# Patient Record
Sex: Female | Born: 1960 | Race: White | Hispanic: Yes | Marital: Married | Smoking: Former smoker
Health system: Southern US, Community
[De-identification: ages and names within clinical notes are randomized; demographics above are authoritative.]

## PROBLEM LIST (undated history)

## (undated) DIAGNOSIS — N950 Postmenopausal bleeding: Secondary | ICD-10-CM

## (undated) DIAGNOSIS — Z973 Presence of spectacles and contact lenses: Secondary | ICD-10-CM

## (undated) DIAGNOSIS — F419 Anxiety disorder, unspecified: Secondary | ICD-10-CM

## (undated) DIAGNOSIS — I959 Hypotension, unspecified: Secondary | ICD-10-CM

## (undated) DIAGNOSIS — K9041 Non-celiac gluten sensitivity: Secondary | ICD-10-CM

## (undated) HISTORY — DX: Postmenopausal bleeding: N95.0

## (undated) HISTORY — DX: Anxiety disorder, unspecified: F41.9

## (undated) HISTORY — DX: Hypotension, unspecified: I95.9

## (undated) HISTORY — PX: APPENDECTOMY: SHX54

---

## 2011-05-19 DIAGNOSIS — J309 Allergic rhinitis, unspecified: Secondary | ICD-10-CM | POA: Insufficient documentation

## 2011-06-09 DIAGNOSIS — R29898 Other symptoms and signs involving the musculoskeletal system: Secondary | ICD-10-CM | POA: Insufficient documentation

## 2011-06-09 DIAGNOSIS — M539 Dorsopathy, unspecified: Secondary | ICD-10-CM | POA: Insufficient documentation

## 2011-06-09 DIAGNOSIS — M48061 Spinal stenosis, lumbar region without neurogenic claudication: Secondary | ICD-10-CM | POA: Insufficient documentation

## 2015-08-06 ENCOUNTER — Ambulatory Visit: Payer: Self-pay | Admitting: Urology

## 2015-08-08 ENCOUNTER — Ambulatory Visit (INDEPENDENT_AMBULATORY_CARE_PROVIDER_SITE_OTHER): Payer: PRIVATE HEALTH INSURANCE | Admitting: Urology

## 2015-08-08 ENCOUNTER — Encounter: Payer: Self-pay | Admitting: Urology

## 2015-08-08 VITALS — BP 94/61 | HR 69 | Ht 64.0 in | Wt 152.6 lb

## 2015-08-08 DIAGNOSIS — N952 Postmenopausal atrophic vaginitis: Secondary | ICD-10-CM

## 2015-08-08 DIAGNOSIS — R300 Dysuria: Secondary | ICD-10-CM

## 2015-08-08 LAB — URINALYSIS, COMPLETE
Bilirubin, UA: NEGATIVE
GLUCOSE, UA: NEGATIVE
KETONES UA: NEGATIVE
LEUKOCYTES UA: NEGATIVE
Nitrite, UA: NEGATIVE
Protein, UA: NEGATIVE
SPEC GRAV UA: 1.015 (ref 1.005–1.030)
Urobilinogen, Ur: 1 mg/dL (ref 0.2–1.0)
pH, UA: 8.5 — ABNORMAL HIGH (ref 5.0–7.5)

## 2015-08-08 LAB — MICROSCOPIC EXAMINATION
BACTERIA UA: NONE SEEN
WBC, UA: NONE SEEN /hpf (ref 0–?)

## 2015-08-08 MED ORDER — ESTROGENS, CONJUGATED 0.625 MG/GM VA CREA: 1.0000 | TOPICAL_CREAM | Freq: Every day | VAGINAL | Status: DC

## 2015-08-08 NOTE — Progress Notes (Signed)
08/08/2015 5:09 PM   Sheila Butler [birth year] [id number]  Referring provider: Janyth Contes, MD [address] [place], [place] [address]  Chief Complaint  Patient presents with  . New Patient (Initial Visit)    dysuria    HPI: 55 year old female referred for further evaluation of recurrent urinary tract infections.  She notes that she is been treated on 3 separate occasions over the past year for urinary tract infections. Prior to a year ago, she had no issues with this.  Review of UA/urine culture data reveal several suspicious UAs with presence of leukocyte esterase with urine cultures growing only 10-25,000 colonies of mixed urogenital flora.  It appears that she was treated both in February and April 2017 for these "infections".  With each incident, she describes clear onset of dysuria, change in her urine color and odor, and lower abdominal discomfort with urinary urgency/frequency. Each time, the symptoms improved with antibiotics. No associated fevers or chills.  No flank pain.   No associated gross hematuria.  She is sexually active with one partner. No risk for sexual transmitted infections. No pain with intercourse but she does have a history of significant vaginal dryness.  She does use a water-soluble lubricant with sexual activity. Incidentally, since developing these episodes of dysuria, she did change her lubricant to a scented brand and not her usual [place]. She since switched back.  She is perimenopausal and has irregular menses over the past year.  She does still have her uterus.   No history of kidney stones. Her renal function is normal.  She only recently started voiding after intercourse and wiping front to back.  She has also recently started a probiotic.  No issues with urinary leakage or incontinence.  She is otherwise remarkably healthy and takes no medications.  No personal history of breast cancer.  PMH: History reviewed. No pertinent  past medical history.  Surgical History: History reviewed. No pertinent past surgical history.  Home Medications:    Medication List       This list is accurate as of: 08/08/15 11:59 PM.  Always use your most recent med list.               azelastine 0.1 % nasal spray  Commonly known as:  ASTELIN     Biotin 10 MG Caps  Take 1 capsule by mouth.     cholecalciferol 1000 units tablet  Commonly known as:  VITAMIN D  Take 1,000 Units by mouth daily.     conjugated estrogens vaginal cream  Commonly known as:  PREMARIN  Place 1 Applicatorful vaginally daily. Use pea sized amount per urethra on M-W-Fr     vitamin B-12 100 MCG tablet  Commonly known as:  CYANOCOBALAMIN  Take 100 mcg by mouth daily.        Allergies:  Allergies  Allergen Reactions  . Calcium Other (See Comments)  . Codeine     Other reaction(s): Unknown  . Penicillins Itching    Family History: History reviewed. No pertinent family history.  Social History:  reports that she quit smoking about 27 years ago. She does not have any smokeless tobacco history on file. She reports that she drinks alcohol. She reports that she does not use illicit drugs.  ROS: UROLOGY Frequent Urination?: No Hard to postpone urination?: No Burning/pain with urination?: No Get up at night to urinate?: No Leakage of urine?: No Urine stream starts and stops?: No Trouble starting stream?: No Do you have to  strain to urinate?: No Blood in urine?: No Urinary tract infection?: No Sexually transmitted disease?: No Injury to kidneys or bladder?: No Painful intercourse?: No Weak stream?: No Currently pregnant?: No Vaginal bleeding?: No Last menstrual period?: perimenopausal  Gastrointestinal Nausea?: No Vomiting?: No Indigestion/heartburn?: No Diarrhea?: No Constipation?: No  Constitutional Fever: No Night sweats?: No Weight loss?: No Fatigue?: Yes  Skin Skin rash/lesions?: No Itching?: No  Eyes Blurred  vision?: No Double vision?: No  Ears/Nose/Throat Sore throat?: No Sinus problems?: No  Hematologic/Lymphatic Swollen glands?: No Easy bruising?: No  Cardiovascular Leg swelling?: No Chest pain?: No  Respiratory Cough?: No Shortness of breath?: No  Endocrine Excessive thirst?: No  Musculoskeletal Back pain?: No Joint pain?: Yes  Neurological Headaches?: No Dizziness?: No  Psychologic Depression?: No Anxiety?: No  Physical Exam: BP 94/61 mmHg  Pulse 69  Ht 5\' 4"  (1.626 m)  Wt 152 lb 9.6 oz (69.219 kg)  BMI 26.18 kg/m2  Constitutional:  Alert and oriented, No acute distress. HEENT: [place] AT, moist mucus membranes.  Trachea midline, no masses. Cardiovascular: No clubbing, cyanosis, or edema. Respiratory: Normal respiratory effort, no increased work of breathing. GI: Abdomen is soft, nontender, nondistended, no abdominal masses GU: No CVA tenderness.  Skin: No rashes, bruises or suspicious lesions. Lymph: No cervical  adenopathy. Neurologic: Grossly intact, no focal deficits, moving all 4 extremities. Psychiatric: Normal mood and affect.  Laboratory Data: Urinalysis Results for orders placed or performed in visit on 08/08/15  Microscopic Examination  Result Value Ref Range   WBC, UA None seen 0 -  5 /hpf   RBC, UA 0-2 0 -  2 /hpf   Epithelial Cells (non renal) 0-10 0 - 10 /hpf   Crystals Present (A) N/A   Crystal Type Amorphous Sediment N/A   Bacteria, UA None seen None seen/Few  Urinalysis, Complete  Result Value Ref Range   Specific Gravity, UA 1.015 1.005 - 1.030   pH, UA 8.5 (H) 5.0 - 7.5   Color, UA Yellow Yellow   Appearance Ur Cloudy (A) Clear   Leukocytes, UA Negative Negative   Protein, UA Negative Negative/Trace   Glucose, UA Negative Negative   Ketones, UA Negative Negative   RBC, UA Trace (A) Negative   Bilirubin, UA Negative Negative   Urobilinogen, Ur 1.0 0.2 - 1.0 mg/dL   Nitrite, UA Negative Negative   Microscopic Examination See  below:     Pertinent Imaging:  n/a  Assessment & Plan:    1. Dysuria 3 distinct episodes of dysuria and UTI type symptoms which resolved with antibiotics. Urine cultures were incidentally negative but symptoms are consistent with cystitis.  UA today negative.    We discussed the pathophysiology of recurrent urinary tract infections Encouraged her return to our office if she develops any further symptoms for a catheterized specimen/urine culture We did discuss general hygiene issues related to UTI prevention Agree with use of probiotic Advise considering initiation of cranberry tabs twice a day for urinary acidification Topical estrogen cream as below  - Urinalysis, Complete   2. Atrophic vaginitis Vaginal dryness with sexual intercourse  Discussed the role of topical estrogen cream and perimenopausal/postmenopausal women with recurrent UTIs and vaginal dryness.  We discussed how to use this medications and contraindications. She has no personal history of breast cancer. We discussed applying the medication using a piece size amount per urethra 3 times a week, Monday Wednesday and Friday before bed and to wash hands carefully.   Return if symptoms worsen or  fail to improve.  Hollice Espy, MD  [facility] [address] [place], [place] [address] [phone]

## 2015-08-11 ENCOUNTER — Encounter: Payer: Self-pay | Admitting: Urology

## 2016-03-01 HISTORY — PX: BLEPHAROPLASTY: SUR158

## 2016-09-10 DIAGNOSIS — N959 Unspecified menopausal and perimenopausal disorder: Secondary | ICD-10-CM | POA: Insufficient documentation

## 2017-11-29 ENCOUNTER — Encounter: Payer: Self-pay | Admitting: *Deleted

## 2017-12-09 ENCOUNTER — Encounter: Payer: Self-pay | Admitting: Gynecologic Oncology

## 2017-12-09 ENCOUNTER — Inpatient Hospital Stay: Payer: No Typology Code available for payment source | Attending: Gynecologic Oncology | Admitting: Gynecologic Oncology

## 2017-12-09 VITALS — BP 109/64 | HR 54 | Temp 98.5°F | Resp 20 | Ht 64.0 in | Wt 155.2 lb

## 2017-12-09 DIAGNOSIS — N8501 Benign endometrial hyperplasia: Secondary | ICD-10-CM | POA: Diagnosis not present

## 2017-12-09 DIAGNOSIS — Z800 Family history of malignant neoplasm of digestive organs: Secondary | ICD-10-CM

## 2017-12-09 NOTE — Progress Notes (Signed)
Consult Note: Gyn-Onc  Consult was requested by Dr. Nile Dear for the evaluation of Aleesia Henney 57 y.o. female  CC:  Chief Complaint  Patient presents with  . Complex endometrial hyperplasia    Second Opinion    Assessment/Plan:  Ms. Davis Vannatter  is a 57 y.o.  year old with complex endometrial hyperplasia without atypia.  I had a discussion with Ms Wilcoxson that the options are hysterectomy with bilateral salpingectomy (+/- oophorectomy), or progestin therapy (IUD vs oral) with resampling at 3 months.  I explained the definitive nature of hysterectomy, and explained that this would ensure no occult cancers were missed. I explained that the risk of this developing into cancer was likely 3% if no atypia is present, 30% if atypia present. I explained that there is a risk for occult cancer adjacent to the blind biopsy site.  I explained hysterectomy risks and recoveries. I explained that it would be reasonable to preserve ovaries at her age.  She has a father with colon cancer history and no other risk factors for endometrial hyperplasia, and therefore Lynch syndrome is certainly a concern, and I explained to the patient that this would increase her future risk for endometrial cancer.  She is going to consider her two options (MIS hysterectomy vs progestins) and let our office know if she is interested in receiving treatment with [place] for this. We have referred her to [place] for colonoscopy screening.   HPI: Ms Wahler is a 57 year old woman who has complex endometrial hyperplasia without atypia on biopsy for endometrial bleeding.  She has a history of postmenopausal bleeding that was light and intermittent in 2019 she was seen by Dr. Melba Coon who performed a transvaginal ultrasound scan on 10/25/2017 which revealed a small uterus measuring 5.3 x 5 x 4.05 cm with an endometrial thickness that was mildly enlarged at 5.26 mm.  The left ovary was normal the right ovary contained a 1.8 cm simple  cyst.  Due to the thickness of the endometrium she underwent endometrial sampling with a Pipelle biopsy in the office on October 21, 2017.  This revealed complex endometrial hyperplasia without atypia.  There was also atrophic endometrium with breakdown seen in the specimen.  The patient has a history of normal Pap smears.  She is a very healthy woman.  She takes low-dose gabapentin to help control hot flashes.  She states that she is very sensitive to medications and prefers to avoid having to take medications.  Dr. Melba Coon recommended laparoscopic assisted total hysterectomy BSO.  The patient expresses some concerns about proceeding with hysterectomy and is interested in understanding about all other options.  The patient's family history significant for father with a history of colon cancer in his 60s.  There are no other family history concerning for Lynch syndrome.  She has had a prior appendectomy as a child but no other prior abdominal surgeries.  She has had one prior vaginal delivery.  Current Meds:  Outpatient Encounter Medications as of 12/09/2017  Medication Sig  . Biotin 10 MG CAPS Take 1 capsule by mouth.  . cholecalciferol (VITAMIN D) 1000 units tablet Take 2,000 Units by mouth 3 (three) times daily.   . Cranberry 500 MG TABS Take 1 tablet by mouth daily.  Marland Kitchen gabapentin (NEURONTIN) 100 MG capsule Take 100 mg by mouth daily.  Marland Kitchen OVER THE COUNTER MEDICATION Take 1 tablet by mouth daily. Patient states she takes a probiotic once a day.  . vitamin B-12 (CYANOCOBALAMIN) 100 MCG  tablet Take 100 mcg by mouth daily.  . [DISCONTINUED] azelastine (ASTELIN) 0.1 % nasal spray   . [DISCONTINUED] conjugated estrogens (PREMARIN) vaginal cream Place 1 Applicatorful vaginally daily. Use pea sized amount per urethra on M-W-Fr (Patient not taking: Reported on 12/09/2017)   No facility-administered encounter medications on file as of 12/09/2017.     Allergy:  Allergies  Allergen Reactions  .  Penicillins Itching  . Calcium Other (See Comments)    Patient states that the medication gives her drowsiness  and headaches  . Codeine     Other reaction(s): Dizziness, nausea, shortness of breathe  . Gluten Meal     Patient has a gluten intolerance  . Ciprofloxacin Rash  . Oysters [Shellfish Allergy] Rash    Social Hx:   Social History   Socioeconomic History  . Marital status: Married    Spouse name: Not on file  . Number of children: Not on file  . Years of education: Not on file  . Highest education level: Not on file  Occupational History  . Not on file  Social Needs  . Financial resource strain: Not on file  . Food insecurity:    Worry: Not on file    Inability: Not on file  . Transportation needs:    Medical: Not on file    Non-medical: Not on file  Tobacco Use  . Smoking status: Former Smoker    Last attempt to quit: 08/07/1988    Years since quitting: 29.3  . Smokeless tobacco: Never Used  . Tobacco comment: Patient states she quit 30 years ago  Substance and Sexual Activity  . Alcohol use: Yes    Alcohol/week: 0.0 standard drinks    Comment: 1 glass of wine once a week with a meal  . Drug use: No  . Sexual activity: Not on file  Lifestyle  . Physical activity:    Days per week: Not on file    Minutes per session: Not on file  . Stress: Not on file  Relationships  . Social connections:    Talks on phone: Not on file    Gets together: Not on file    Attends religious service: Not on file    Active member of club or organization: Not on file    Attends meetings of clubs or organizations: Not on file    Relationship status: Not on file  . Intimate partner violence:    Fear of current or ex partner: Not on file    Emotionally abused: Not on file    Physically abused: Not on file    Forced sexual activity: Not on file  Other Topics Concern  . Not on file  Social History Narrative  . Not on file    Past Surgical Hx:  Past Surgical History:   Procedure Laterality Date  . APPENDECTOMY     Patient was 57years old  . EYE SURGERY  2018   Patient had a surgery to tighten her eye lid    Past Medical Hx:  Past Medical History:  Diagnosis Date  . Postmenopausal bleeding     Past Gynecological History:  SVD x 1 No LMP recorded. (Menstrual status: Perimenopausal).  Family Hx:  Family History  Problem Relation Age of Onset  . Non-Hodgkin's lymphoma Mother   . Colon cancer Father   . Heart disease Father   . Parkinson's disease Father   . Breast cancer Maternal Grandmother     Review of Systems:  Constitutional  Feels  well,    ENT Normal appearing ears and nares bilaterally Skin/Breast  No rash, sores, jaundice, itching, dryness Cardiovascular  No chest pain, shortness of breath, or edema  Pulmonary  No cough or wheeze.  Gastro Intestinal  No nausea, vomitting, or diarrhoea. No bright red blood per rectum, no abdominal pain, change in bowel movement, or constipation.  Genito Urinary  No frequency, urgency, dysuria, + bleeding Musculo Skeletal  No myalgia, arthralgia, joint swelling or pain  Neurologic  No weakness, numbness, change in gait,  Psychology  No depression, anxiety, insomnia.   Vitals:  Blood pressure 109/64, pulse (!) 54, temperature 98.5 F (36.9 C), temperature source Oral, resp. rate 20, height 5\' 4"  (1.626 m), weight 155 lb 3.2 oz (70.4 kg), SpO2 100 %.  Physical Exam: WD in NAD Neck  Supple NROM, without any enlargements.  Lymph Node Survey No cervical supraclavicular or inguinal adenopathy Cardiovascular  Pulse normal rate, regularity and rhythm. S1 and S2 normal.  Lungs  Clear to auscultation bilateraly, without wheezes/crackles/rhonchi. Good air movement.  Skin  No rash/lesions/breakdown  Psychiatry  Alert and oriented to person, place, and time  Abdomen  Normoactive bowel sounds, abdomen soft, non-tender and thin without evidence of hernia.  Back No CVA tenderness Genito  Urinary  Vulva/vagina: Normal external female genitalia.  No lesions. No discharge or bleeding.  Bladder/urethra:  No lesions or masses, well supported bladder  Vagina: normal  Cervix: Normal appearing, no lesions.  Uterus:  Small, mobile, no parametrial involvement or nodularity.  Adnexa: no palpable masses. Rectal  deferred Extremities  No bilateral cyanosis, clubbing or edema.   Thereasa Solo, MD  12/09/2017, 1:51 PM

## 2017-12-09 NOTE — Patient Instructions (Signed)
Dr Denman George is recommending either hysterectomy or progesterone therapy.  The progesterone therapy can be administered via tablets or via an intrauterine device with sampling at 3 monthly intervals to assess for response.  Alternatively, a hysterectomy could be performed through small incisions with removal of the fallopian tubes and cervix. The ovaries do not need to be removed.   Dr Serita Grit office can be contacted at [phone] with questions about this.  Dr Denman George has ordered an evaluation with GI physicians for colonoscopy.

## 2017-12-15 ENCOUNTER — Telehealth: Payer: Self-pay | Admitting: Gynecologic Oncology

## 2017-12-15 NOTE — Telephone Encounter (Signed)
Returned call to patient. Left message.

## 2017-12-16 ENCOUNTER — Telehealth: Payer: Self-pay | Admitting: Gynecologic Oncology

## 2017-12-16 ENCOUNTER — Ambulatory Visit (AMBULATORY_SURGERY_CENTER): Payer: Self-pay | Admitting: *Deleted

## 2017-12-16 VITALS — Ht 64.0 in | Wt 155.0 lb

## 2017-12-16 DIAGNOSIS — Z800 Family history of malignant neoplasm of digestive organs: Secondary | ICD-10-CM

## 2017-12-16 NOTE — Telephone Encounter (Signed)
Spoke with patient and advised her of Dr. Serita Grit recommendations that it is not essential for her to have the colonoscopy before her surgery.  The patient had called yesterday stating she has decided to move forward with surgery and was asking about the timing of her colonoscopy. Discussed potential OR openings and she states she needs to speak with her family and she will call back to select a date.  All questions answered. No concerns voiced at the end of the call.

## 2017-12-16 NOTE — Progress Notes (Signed)
No egg or soy allergy known to patient  No issues with past sedation with any surgeries  or procedures, no intubation problems  No diet pills per patient No home 02 use per patient  No blood thinners per patient  Pt denies issues with constipation  No A fib or A flutter  EMMI video sent to pt's e mail  

## 2017-12-19 ENCOUNTER — Telehealth: Payer: Self-pay

## 2017-12-19 NOTE — Telephone Encounter (Signed)
Outgoing call to patient regarding per Joylene John NP " does she need to meet with Dr Denman George prior to surgery or does she feel comfortable and knowledgeable about procedure?"  Pt reports she feels comfortable / knowledgeable - states " I really do" and denies needing an appt prior.  No other needs per pt at this time.

## 2017-12-20 ENCOUNTER — Telehealth: Payer: Self-pay | Admitting: Gynecologic Oncology

## 2017-12-20 NOTE — Telephone Encounter (Signed)
Called patient to discuss her upcoming surgery and to see if she had decided what she would like to do with her ovaries.  She states her and her husband have decided that she would like to KEEP her ovaries at this time. No other concerns voiced.  Advised to call for any needs.  She will be scheduled for Nov 21 per her request.

## 2017-12-23 ENCOUNTER — Telehealth: Payer: Self-pay | Admitting: Oncology

## 2017-12-23 ENCOUNTER — Telehealth: Payer: Self-pay | Admitting: Gastroenterology

## 2017-12-23 NOTE — Telephone Encounter (Signed)
Sheila Butler called and wanted to know her out of pocket cost for surgery.  Advised her we do not have access to billing but that we will call to see how much it will be.

## 2017-12-27 ENCOUNTER — Encounter: Payer: Self-pay | Admitting: Gynecologic Oncology

## 2017-12-30 ENCOUNTER — Encounter: Payer: Self-pay | Admitting: Gastroenterology

## 2017-12-30 ENCOUNTER — Ambulatory Visit (AMBULATORY_SURGERY_CENTER): Payer: No Typology Code available for payment source | Admitting: Gastroenterology

## 2017-12-30 VITALS — BP 121/65 | HR 61 | Temp 97.7°F | Resp 12 | Ht 64.0 in | Wt 155.0 lb

## 2017-12-30 DIAGNOSIS — D122 Benign neoplasm of ascending colon: Secondary | ICD-10-CM

## 2017-12-30 DIAGNOSIS — D123 Benign neoplasm of transverse colon: Secondary | ICD-10-CM | POA: Diagnosis not present

## 2017-12-30 DIAGNOSIS — Z1211 Encounter for screening for malignant neoplasm of colon: Secondary | ICD-10-CM

## 2017-12-30 DIAGNOSIS — Z800 Family history of malignant neoplasm of digestive organs: Secondary | ICD-10-CM | POA: Diagnosis not present

## 2017-12-30 HISTORY — PX: COLONOSCOPY: SHX174

## 2017-12-30 MED ORDER — SODIUM CHLORIDE 0.9 % IV SOLN: 500.0000 mL | Freq: Once | INTRAVENOUS | Status: DC

## 2017-12-30 NOTE — Progress Notes (Signed)
Report given to PACU, vss 

## 2017-12-30 NOTE — Op Note (Signed)
[facility] Patient Name: Sheila Butler Procedure Date: 12/30/2017 8:34 AM MRN: [id number] Endoscopist: Mauri Pole , MD Age: 57 Referring MD:  Date of Birth: May 05, 1960 Gender: Female Account #: [id number] Procedure:                Colonoscopy Indications:              Screening patient at increased risk: Family history                            of 1st-degree relative with colorectal cancer at                            age 52 years (or older) Medicines:                Monitored Anesthesia Care Procedure:                Pre-Anesthesia Assessment:                           - Prior to the procedure, a History and Physical                            was performed, and patient medications and                            allergies were reviewed. The patient's tolerance of                            previous anesthesia was also reviewed. The risks                            and benefits of the procedure and the sedation                            options and risks were discussed with the patient.                            All questions were answered, and informed consent                            was obtained. Prior Anticoagulants: The patient has                            taken no previous anticoagulant or antiplatelet                            agents. ASA Grade Assessment: I - A normal, healthy                            patient. After reviewing the risks and benefits,                            the patient was deemed in satisfactory condition to  undergo the procedure.                           After obtaining informed consent, the colonoscope                            was passed under direct vision. Throughout the                            procedure, the patient's blood pressure, pulse, and                            oxygen saturations were monitored continuously. The                            Colonoscope was introduced through the  anus and                            advanced to the the cecum, identified by                            appendiceal orifice and ileocecal valve. The                            colonoscopy was performed without difficulty. The                            patient tolerated the procedure well. The quality                            of the bowel preparation was adequate to identify                            polyps 6 mm and larger in size. The ileocecal                            valve, appendiceal orifice, and rectum were                            photographed. Scope In: 6:28:31 AM Scope Out: 9:11:47 AM Scope Withdrawal Time: 0 hours 12 minutes 50 seconds  Total Procedure Duration: 0 hours 25 minutes 28 seconds  Findings:                 The perianal and digital rectal examinations were                            normal.                           A 6 mm polyp was found in the descending colon. The                            polyp was sessile. The polyp was removed with a  cold snare. Resection was complete, but the polyp                            tissue was not retrieved.                           A 3 mm polyp was found in the ascending colon. The                            polyp was flat. The polyp was removed with a cold                            biopsy forceps. Resection and retrieval were                            complete.                           A 9 mm polyp was found in the transverse colon. The                            polyp was pedunculated. The polyp was removed with                            a hot snare. Resection and retrieval were complete.                           Non-bleeding internal hemorrhoids were found during                            retroflexion. The hemorrhoids were small. Complications:            No immediate complications. Estimated Blood Loss:     Estimated blood loss was minimal. Impression:               - One 6 mm polyp in  the descending colon, removed                            with a cold snare. Complete resection. Polyp tissue                            not retrieved.                           - One 3 mm polyp in the ascending colon, removed                            with a cold biopsy forceps. Resected and retrieved.                           - One 9 mm polyp in the transverse colon, removed                            with a hot snare.  Resected and retrieved.                           - Non-bleeding internal hemorrhoids. Recommendation:           - Patient has a contact number available for                            emergencies. The signs and symptoms of potential                            delayed complications were discussed with the                            patient. Return to normal activities tomorrow.                            Written discharge instructions were provided to the                            patient.                           - Resume previous diet.                           - Continue present medications.                           - Await pathology results.                           - Repeat colonoscopy in 1 year because the bowel                            preparation was suboptimal.                           - For future colonoscopy the patient will require                            an extended preparation. If there are any                            questions, please contact the gastroenterologist. Mauri Pole, MD 12/30/2017 9:16:52 AM This report has been signed electronically.

## 2017-12-30 NOTE — Patient Instructions (Addendum)
YOU HAD AN ENDOSCOPIC PROCEDURE TODAY AT [facility]:   Refer to the procedure report that was given to you for any specific questions about what was found during the examination.  If the procedure report does not answer your questions, please call your gastroenterologist to clarify.  If you requested that your care partner not be given the details of your procedure findings, then the procedure report has been included in a sealed envelope for you to review at your convenience later.  YOU SHOULD EXPECT: Some feelings of bloating in the abdomen. Passage of more gas than usual.  Walking can help get rid of the air that was put into your GI tract during the procedure and reduce the bloating. If you had a lower endoscopy (such as a colonoscopy or flexible sigmoidoscopy) you may notice spotting of blood in your stool or on the toilet paper. If you underwent a bowel prep for your procedure, you may not have a normal bowel movement for a few days.  Please Note:  You might notice some irritation and congestion in your nose or some drainage.  This is from the oxygen used during your procedure.  There is no need for concern and it should clear up in a day or so.  SYMPTOMS TO REPORT IMMEDIATELY:   Following lower endoscopy (colonoscopy or flexible sigmoidoscopy):  Excessive amounts of blood in the stool  Significant tenderness or worsening of abdominal pains  Swelling of the abdomen that is new, acute  Fever of 100F or higher   For urgent or emergent issues, a gastroenterologist can be reached at any hour by calling [phone].   DIET:  We do recommend a small meal at first, but then you may proceed to your regular diet.  Drink plenty of fluids but you should avoid alcoholic beverages for 24 hours.  ACTIVITY:  You should plan to take it easy for the rest of today and you should NOT DRIVE or use heavy machinery until tomorrow (because of the sedation medicines used during the test).     FOLLOW UP: Our staff will call the number listed on your records the next business day following your procedure to check on you and address any questions or concerns that you may have regarding the information given to you following your procedure. If we do not reach you, we will leave a message.  However, if you are feeling well and you are not experiencing any problems, there is no need to return our call.  We will assume that you have returned to your regular daily activities without incident.  If any biopsies were taken you will be contacted by phone or by letter within the next 1-3 weeks.  Please call us at [phone] if you have not heard about the biopsies in 3 weeks.    SIGNATURES/CONFIDENTIALITY: You and/or your care partner have signed paperwork which will be entered into your electronic medical record.  These signatures attest to the fact that that the information above on your After Visit Summary has been reviewed and is understood.  Full responsibility of the confidentiality of this discharge information lies with you and/or your care-partner.  Polyp and hemorrhoid information given.  Follow-up one year with extended  Prep.

## 2017-12-30 NOTE — Progress Notes (Signed)
Called to room to assist during endoscopic procedure.  Patient ID and intended procedure confirmed with present staff. Received instructions for my participation in the procedure from the performing physician.  

## 2017-12-30 NOTE — Progress Notes (Signed)
Pt's states no medical or surgical changes since previsit or office visit. 

## 2018-01-02 ENCOUNTER — Telehealth: Payer: Self-pay

## 2018-01-02 NOTE — Telephone Encounter (Signed)
Follow up phone call attempt, no dial tone.  Unable to leave message

## 2018-01-02 NOTE — Telephone Encounter (Signed)
Attempted to reach pt. For follow-up call following endoscopic procedure 12/30/2017.  Number given for follow-up call did not ring.  Unable to LM at that no.

## 2018-01-06 ENCOUNTER — Encounter: Payer: Self-pay | Admitting: Gastroenterology

## 2018-01-12 NOTE — Patient Instructions (Addendum)
Sheila Butler  September 01, 1960    Your procedure is scheduled on:  01-19-2018    Report to [facility] Main  Entrance, Report to admitting at  5:30 AM     Call this number if you have problems the morning of surgery [phone]        Eat a light diet the day before surgery.  Examples including soups, broths, toast, yogurt, mashed potatoes.  Things to avoid include carbonated beverages (fizzy beverages), raw fruits and raw vegetables, or beans.   If your bowels are filled with gas, your surgeon will have difficulty visualizing your pelvic organs which increases your surgical risks.      Remember: NO SOLID FOOD AFTER MIDNIGHT THE NIGHT PRIOR TO SURGERY. NOTHING BY MOUTH EXCEPT CLEAR LIQUIDS UNTIL 3 HOURS PRIOR TO [place] SURGERY. PLEASE FINISH ENSURE   DRINK PER SURGEON ORDER 3 HOURS PRIOR TO SCHEDULED SURGERY TIME WHICH NEEDS TO BE COMPLETED AT ____4:30____.      BRUSH YOUR TEETH MORNING OF SURGERY AND RINSE YOUR MOUTH OUT, NO CHEWING GUM, CANDY, OR MINTS.         Take these medicines the morning of surgery with A SIP OF WATER:   NONE                                    You may not have any metal on your body including hair pins and piercings              Do not wear jewelry, make-up, lotions, powders or perfumes, deodorant              Do not wear nail polish.  Do not shave  48 hours prior to surgery.                  Do not bring valuables to the hospital. [place].  Contacts, dentures or bridgework may not be worn into surgery.  Leave suitcase in the car. After surgery it may be brought to your room.     Patients discharged the day of surgery will not be allowed to drive home.  Name and phone number of your driver:  Special Instructions: N/A   _____________________________________________________________________      CLEAR LIQUID DIET   Foods Allowed                                                                      Foods Excluded  Coffee and tea, regular and decaf                             liquids that you cannot  Plain Jell-O in any flavor                                             see through such  as: Fruit ices (not with fruit pulp)                                     milk, soups, orange juice  Iced Popsicles                                    All solid food Carbonated beverages, regular and diet                                    Cranberry, grape and apple juices Sports drinks like Gatorade Lightly seasoned clear broth or consume(fat free) Sugar, honey syrup  Sample Menu Breakfast                                Lunch                                     Supper Cranberry juice                    Beef broth                            Chicken broth Jell-O                                     Grape juice                           Apple juice Coffee or tea                        Jell-O                                      Popsicle                                                Coffee or tea                        Coffee or tea  _____________________________________________________________________            [name] - Preparing for Surgery Before surgery, you can play an important role.  Because skin is not sterile, your skin needs to be as free of germs as possible.  You can reduce the number of germs on your skin by washing with CHG (chlorahexidine gluconate) soap before surgery.  CHG is an antiseptic cleaner which kills germs and bonds with the skin to continue killing germs even after washing. Please DO NOT use if you have an allergy to CHG or antibacterial soaps.  If your skin becomes reddened/irritated stop using the CHG and inform your nurse when you arrive at Short Stay. Do  not shave (including legs and underarms) for at least 48 hours prior to the first CHG shower.  You may shave your face/neck. Please follow these instructions  carefully:  1.  Shower with CHG Soap the night before surgery and the  morning of Surgery.  2.  If you choose to wash your hair, wash your hair first as usual with your  normal  shampoo.  3.  After you shampoo, rinse your hair and body thoroughly to remove the  shampoo.                            4.  Use CHG as you would any other liquid soap.  You can apply chg directly  to the skin and wash                       Gently with a scrungie or clean washcloth.  5.  Apply the CHG Soap to your body ONLY FROM THE NECK DOWN.   Do not use on face/ open                           Wound or open sores. Avoid contact with eyes, ears mouth and genitals (private parts).                       Wash face,  Genitals (private parts) with your normal soap.             6.  Wash thoroughly, paying special attention to the area where your surgery  will be performed.  7.  Thoroughly rinse your body with warm water from the neck down.  8.  DO NOT shower/wash with your normal soap after using and rinsing off  the CHG Soap.             9.  Pat yourself dry with a clean towel.            10.  Wear clean pajamas.            11.  Place clean sheets on your bed the night of your first shower and do not  sleep with pets. Day of Surgery : Do not apply any lotions/deodorants the morning of surgery.  Please wear clean clothes to the hospital/surgery center.  FAILURE TO FOLLOW THESE INSTRUCTIONS MAY RESULT IN THE CANCELLATION OF YOUR SURGERY PATIENT SIGNATURE_________________________________  NURSE SIGNATURE__________________________________  ________________________________________________________________________   [name]  An incentive spirometer is a tool that can help keep your lungs clear and active. This tool measures how well you are filling your lungs with each breath. Taking long deep breaths may help reverse or decrease the chance of developing breathing (pulmonary) problems (especially infection)  following:  A long period of time when you are unable to move or be active. BEFORE THE PROCEDURE   If the spirometer includes an indicator to show your best effort, your nurse or respiratory therapist will set it to a desired goal.  If possible, sit up straight or lean slightly forward. Try not to slouch.  Hold the incentive spirometer in an upright position. INSTRUCTIONS FOR USE  1. Sit on the edge of your bed if possible, or sit up as far as you can in bed or on a chair. 2. Hold the incentive spirometer in an upright position. 3. Breathe out normally. 4. Place the mouthpiece in your mouth  and seal your lips tightly around it. 5. Breathe in slowly and as deeply as possible, raising the piston or the ball toward the top of the column. 6. Hold your breath for 3-5 seconds or for as long as possible. Allow the piston or ball to fall to the bottom of the column. 7. Remove the mouthpiece from your mouth and breathe out normally. 8. Rest for a few seconds and repeat Steps 1 through 7 at least 10 times every 1-2 hours when you are awake. Take your time and take a few normal breaths between deep breaths. 9. The spirometer may include an indicator to show your best effort. Use the indicator as a goal to work toward during each repetition. 10. After each set of 10 deep breaths, practice coughing to be sure your lungs are clear. If you have an incision (the cut made at the time of surgery), support your incision when coughing by placing a pillow or rolled up towels firmly against it. Once you are able to get out of bed, walk around indoors and cough well. You may stop using the incentive spirometer when instructed by your caregiver.  RISKS AND COMPLICATIONS  Take your time so you do not get dizzy or light-headed.  If you are in pain, you may need to take or ask for pain medication before doing incentive spirometry. It is harder to take a deep breath if you are having pain. AFTER USE  Rest and  breathe slowly and easily.  It can be helpful to keep track of a log of your progress. Your caregiver can provide you with a simple table to help with this. If you are using the spirometer at home, follow these instructions: [place] IF:   You are having difficultly using the spirometer.  You have trouble using the spirometer as often as instructed.  Your pain medication is not giving enough relief while using the spirometer.  You develop fever of 100.5 F (38.1 C) or higher. SEEK IMMEDIATE MEDICAL CARE IF:   You cough up bloody sputum that had not been present before.  You develop fever of 102 F (38.9 C) or greater.  You develop worsening pain at or near the incision site. MAKE SURE YOU:   Understand these instructions.  Will watch your condition.  Will get help right away if you are not doing well or get worse. Document Released: 06/28/2006 Document Revised: 05/10/2011 Document Reviewed: 08/29/2006 Exit[employer] Patient Information 2014 [employer], [place].   ________________________________________________________________________  WHAT IS A BLOOD TRANSFUSION? Blood Transfusion Information  A transfusion is the replacement of blood or some of its parts. Blood is made up of multiple cells which provide different functions.  Red blood cells carry oxygen and are used for blood loss replacement.  White blood cells fight against infection.  Platelets control bleeding.  Plasma helps clot blood.  Other blood products are available for specialized needs, such as hemophilia or other clotting disorders. BEFORE THE TRANSFUSION  Who gives blood for transfusions?   Healthy volunteers who are fully evaluated to make sure their blood is safe. This is blood bank blood. Transfusion therapy is the safest it has ever been in the practice of medicine. Before blood is taken from a donor, a complete history is taken to make sure that person has no history of diseases nor engages in  risky social behavior (examples are intravenous drug use or sexual activity with multiple partners). The donor's travel history is screened to minimize risk of transmitting  infections, such as malaria. The donated blood is tested for signs of infectious diseases, such as HIV and hepatitis. The blood is then tested to be sure it is compatible with you in order to minimize the chance of a transfusion reaction. If you or a relative donates blood, this is often done in anticipation of surgery and is not appropriate for emergency situations. It takes many days to process the donated blood. RISKS AND COMPLICATIONS Although transfusion therapy is very safe and saves many lives, the main dangers of transfusion include:   Getting an infectious disease.  Developing a transfusion reaction. This is an allergic reaction to something in the blood you were given. Every precaution is taken to prevent this. The decision to have a blood transfusion has been considered carefully by your caregiver before blood is given. Blood is not given unless the benefits outweigh the risks. AFTER THE TRANSFUSION  Right after receiving a blood transfusion, you will usually feel much better and more energetic. This is especially true if your red blood cells have gotten low (anemic). The transfusion raises the level of the red blood cells which carry oxygen, and this usually causes an energy increase.  The nurse administering the transfusion will monitor you carefully for complications. HOME CARE INSTRUCTIONS  No special instructions are needed after a transfusion. You may find your energy is better. Speak with your caregiver about any limitations on activity for underlying diseases you may have. SEEK MEDICAL CARE IF:   Your condition is not improving after your transfusion.  You develop redness or irritation at the intravenous (IV) site. SEEK IMMEDIATE MEDICAL CARE IF:  Any of the following symptoms occur over the next 12  hours:  Shaking chills.  You have a temperature by mouth above 102 F (38.9 C), not controlled by medicine.  Chest, back, or muscle pain.  People around you feel you are not acting correctly or are confused.  Shortness of breath or difficulty breathing.  Dizziness and fainting.  You get a rash or develop hives.  You have a decrease in urine output.  Your urine turns a dark color or changes to pink, red, or brown. Any of the following symptoms occur over the next 10 days:  You have a temperature by mouth above 102 F (38.9 C), not controlled by medicine.  Shortness of breath.  Weakness after normal activity.  The white part of the eye turns yellow (jaundice).  You have a decrease in the amount of urine or are urinating less often.  Your urine turns a dark color or changes to pink, red, or brown. Document Released: 02/13/2000 Document Revised: 05/10/2011 Document Reviewed: 10/02/2007 [facility] Patient Information 2014 [place], [place].  _______________________________________________________________________

## 2018-01-13 ENCOUNTER — Encounter (HOSPITAL_COMMUNITY)
Admission: RE | Admit: 2018-01-13 | Discharge: 2018-01-13 | Disposition: A | Discharge status: Home / Self Care | Payer: No Typology Code available for payment source | Source: Ambulatory Visit | Attending: Gynecologic Oncology | Admitting: Gynecologic Oncology

## 2018-01-13 ENCOUNTER — Encounter (HOSPITAL_COMMUNITY): Payer: Self-pay

## 2018-01-13 ENCOUNTER — Other Ambulatory Visit: Payer: Self-pay

## 2018-01-13 DIAGNOSIS — Z01812 Encounter for preprocedural laboratory examination: Secondary | ICD-10-CM | POA: Diagnosis present

## 2018-01-13 HISTORY — DX: Non-celiac gluten sensitivity: K90.41

## 2018-01-13 HISTORY — DX: Presence of spectacles and contact lenses: Z97.3

## 2018-01-13 LAB — CBC
HCT: 41.2 % (ref 36.0–46.0)
Hemoglobin: 13.5 g/dL (ref 12.0–15.0)
MCH: 29.9 pg (ref 26.0–34.0)
MCHC: 32.8 g/dL (ref 30.0–36.0)
MCV: 91.4 fL (ref 80.0–100.0)
NRBC: 0 % (ref 0.0–0.2)
PLATELETS: 285 10*3/uL (ref 150–400)
RBC: 4.51 MIL/uL (ref 3.87–5.11)
RDW: 12.7 % (ref 11.5–15.5)
WBC: 9.2 10*3/uL (ref 4.0–10.5)

## 2018-01-13 LAB — URINALYSIS, ROUTINE W REFLEX MICROSCOPIC
BILIRUBIN URINE: NEGATIVE
Glucose, UA: NEGATIVE mg/dL
Hgb urine dipstick: NEGATIVE
Ketones, ur: NEGATIVE mg/dL
LEUKOCYTES UA: NEGATIVE
NITRITE: NEGATIVE
PH: 8 (ref 5.0–8.0)
Protein, ur: NEGATIVE mg/dL
SPECIFIC GRAVITY, URINE: 1.011 (ref 1.005–1.030)

## 2018-01-13 LAB — COMPREHENSIVE METABOLIC PANEL
ALBUMIN: 4.3 g/dL (ref 3.5–5.0)
ALT: 21 U/L (ref 0–44)
AST: 21 U/L (ref 15–41)
Alkaline Phosphatase: 63 U/L (ref 38–126)
Anion gap: 5 (ref 5–15)
BUN: 13 mg/dL (ref 6–20)
CALCIUM: 8.8 mg/dL — AB (ref 8.9–10.3)
CHLORIDE: 106 mmol/L (ref 98–111)
CO2: 30 mmol/L (ref 22–32)
CREATININE: 0.73 mg/dL (ref 0.44–1.00)
GFR calc Af Amer: >60 mL/min (ref >60)
GFR calc non Af Amer: >60 mL/min (ref >60)
GLUCOSE: 76 mg/dL (ref 70–99)
Potassium: 4.1 mmol/L (ref 3.5–5.1)
SODIUM: 141 mmol/L (ref 135–145)
Total Bilirubin: 0.7 mg/dL (ref 0.3–1.2)
Total Protein: 6.9 g/dL (ref 6.5–8.1)

## 2018-01-14 LAB — ABO/RH: ABO/RH(D): O POS

## 2018-01-18 MED ORDER — GENTAMICIN SULFATE 40 MG/ML IJ SOLN
INTRAVENOUS | Status: AC
  Administered 2018-01-19: 300 mg via INTRAVENOUS
  Filled 2018-01-18 (×3): qty 7.5

## 2018-01-18 NOTE — Anesthesia Preprocedure Evaluation (Addendum)
Anesthesia Evaluation  Patient identified by MRN, date of birth, ID band Patient awake    Reviewed: Allergy & Precautions, NPO status , Patient's Chart, lab work & pertinent test results  History of Anesthesia Complications Negative for: history of anesthetic complications  Airway Mallampati: II  TM Distance: >3 FB Neck ROM: Full    Dental no notable dental hx.    Pulmonary neg pulmonary ROS, former smoker,    Pulmonary exam normal        Cardiovascular negative cardio ROS Normal cardiovascular exam     Neuro/Psych PSYCHIATRIC DISORDERS Anxiety negative neurological ROS     GI/Hepatic negative GI ROS, Neg liver ROS,   Endo/Other  negative endocrine ROS  Renal/GU negative Renal ROS  negative genitourinary   Musculoskeletal negative musculoskeletal ROS (+)   Abdominal   Peds  Hematology negative hematology ROS (+)   Anesthesia Other Findings   Reproductive/Obstetrics                            Anesthesia Physical Anesthesia Plan  ASA: II  Anesthesia Plan: General   Post-op Pain Management:    Induction: Intravenous  PONV Risk Score and Plan: 4 or greater and Ondansetron, Dexamethasone, Midazolam and Treatment may vary due to age or medical condition  Airway Management Planned: Oral ETT  Additional Equipment: None  Intra-op Plan:   Post-operative Plan: Extubation in OR  Informed Consent: I have reviewed the patients History and Physical, chart, labs and discussed the procedure including the risks, benefits and alternatives for the proposed anesthesia with the patient or authorized representative who has indicated his/her understanding and acceptance.     Plan Discussed with:   Anesthesia Plan Comments:        Anesthesia Quick Evaluation

## 2018-01-19 ENCOUNTER — Encounter (HOSPITAL_COMMUNITY)
Admission: RE | Disposition: A | Discharge status: Home / Self Care | Payer: Self-pay | Source: Ambulatory Visit | Attending: Gynecologic Oncology

## 2018-01-19 ENCOUNTER — Ambulatory Visit (HOSPITAL_COMMUNITY)
Admission: RE | Admit: 2018-01-19 | Discharge: 2018-01-19 | Disposition: A | Discharge status: Home / Self Care | Payer: No Typology Code available for payment source | Source: Ambulatory Visit | Attending: Gynecologic Oncology | Admitting: Gynecologic Oncology

## 2018-01-19 ENCOUNTER — Ambulatory Visit (HOSPITAL_COMMUNITY): Payer: No Typology Code available for payment source | Admitting: Anesthesiology

## 2018-01-19 ENCOUNTER — Encounter (HOSPITAL_COMMUNITY): Payer: Self-pay

## 2018-01-19 DIAGNOSIS — Z888 Allergy status to other drugs, medicaments and biological substances status: Secondary | ICD-10-CM | POA: Diagnosis not present

## 2018-01-19 DIAGNOSIS — N950 Postmenopausal bleeding: Secondary | ICD-10-CM | POA: Diagnosis present

## 2018-01-19 DIAGNOSIS — Z87891 Personal history of nicotine dependence: Secondary | ICD-10-CM | POA: Insufficient documentation

## 2018-01-19 DIAGNOSIS — N8501 Benign endometrial hyperplasia: Secondary | ICD-10-CM | POA: Diagnosis not present

## 2018-01-19 DIAGNOSIS — Z91018 Allergy to other foods: Secondary | ICD-10-CM | POA: Diagnosis not present

## 2018-01-19 DIAGNOSIS — K9041 Non-celiac gluten sensitivity: Secondary | ICD-10-CM | POA: Diagnosis not present

## 2018-01-19 DIAGNOSIS — Z800 Family history of malignant neoplasm of digestive organs: Secondary | ICD-10-CM | POA: Diagnosis not present

## 2018-01-19 DIAGNOSIS — Z79899 Other long term (current) drug therapy: Secondary | ICD-10-CM | POA: Insufficient documentation

## 2018-01-19 DIAGNOSIS — F419 Anxiety disorder, unspecified: Secondary | ICD-10-CM | POA: Insufficient documentation

## 2018-01-19 DIAGNOSIS — Z91013 Allergy to seafood: Secondary | ICD-10-CM | POA: Diagnosis not present

## 2018-01-19 DIAGNOSIS — Z885 Allergy status to narcotic agent status: Secondary | ICD-10-CM | POA: Diagnosis not present

## 2018-01-19 DIAGNOSIS — N838 Other noninflammatory disorders of ovary, fallopian tube and broad ligament: Secondary | ICD-10-CM | POA: Diagnosis not present

## 2018-01-19 DIAGNOSIS — Z880 Allergy status to penicillin: Secondary | ICD-10-CM | POA: Insufficient documentation

## 2018-01-19 HISTORY — PX: ROBOTIC ASSISTED TOTAL HYSTERECTOMY WITH BILATERAL SALPINGO OOPHERECTOMY: SHX6086

## 2018-01-19 LAB — TYPE AND SCREEN
ABO/RH(D): O POS
ANTIBODY SCREEN: NEGATIVE

## 2018-01-19 SURGERY — HYSTERECTOMY, TOTAL, ROBOT-ASSISTED, LAPAROSCOPIC, WITH BILATERAL SALPINGO-OOPHORECTOMY
Anesthesia: General | Site: Abdomen | Laterality: Bilateral

## 2018-01-19 MED ORDER — FENTANYL CITRATE (PF) 100 MCG/2ML IJ SOLN: 25.0000 ug | INTRAMUSCULAR | Status: DC | PRN

## 2018-01-19 MED ORDER — ONDANSETRON HCL 4 MG/2ML IJ SOLN: 4.0000 mg | Freq: Once | INTRAMUSCULAR | Status: DC | PRN

## 2018-01-19 MED ORDER — DEXAMETHASONE SODIUM PHOSPHATE 10 MG/ML IJ SOLN
INTRAMUSCULAR | Status: AC
  Filled 2018-01-19: qty 3

## 2018-01-19 MED ORDER — ACETAMINOPHEN 500 MG PO TABS
1000.0000 mg | ORAL_TABLET | ORAL | Status: AC
  Administered 2018-01-19: 1000 mg via ORAL
  Filled 2018-01-19: qty 2

## 2018-01-19 MED ORDER — FENTANYL CITRATE (PF) 100 MCG/2ML IJ SOLN
INTRAMUSCULAR | Status: AC
  Filled 2018-01-19: qty 2

## 2018-01-19 MED ORDER — BUPIVACAINE HCL (PF) 0.25 % IJ SOLN
INTRAMUSCULAR | Status: AC
  Filled 2018-01-19: qty 30

## 2018-01-19 MED ORDER — LIDOCAINE 2% (20 MG/ML) 5 ML SYRINGE
INTRAMUSCULAR | Status: DC | PRN
  Administered 2018-01-19: 80 mg via INTRAVENOUS

## 2018-01-19 MED ORDER — SODIUM CHLORIDE 0.9 % IV SOLN: 250.0000 mL | INTRAVENOUS | Status: DC | PRN

## 2018-01-19 MED ORDER — TRAMADOL HCL 50 MG PO TABS: 50.0000 mg | ORAL_TABLET | Freq: Four times a day (QID) | ORAL | Status: DC | PRN

## 2018-01-19 MED ORDER — SCOPOLAMINE 1 MG/3DAYS TD PT72
1.0000 | MEDICATED_PATCH | TRANSDERMAL | Status: DC
  Administered 2018-01-19: 1.5 mg via TRANSDERMAL
  Filled 2018-01-19: qty 1

## 2018-01-19 MED ORDER — GABAPENTIN 300 MG PO CAPS
300.0000 mg | ORAL_CAPSULE | ORAL | Status: AC
  Administered 2018-01-19: 300 mg via ORAL
  Filled 2018-01-19: qty 1

## 2018-01-19 MED ORDER — SODIUM CHLORIDE 0.9% FLUSH: 3.0000 mL | Freq: Two times a day (BID) | INTRAVENOUS | Status: DC

## 2018-01-19 MED ORDER — EPHEDRINE 5 MG/ML INJ
INTRAVENOUS | Status: AC
  Filled 2018-01-19: qty 10

## 2018-01-19 MED ORDER — ONDANSETRON HCL 4 MG/2ML IJ SOLN
INTRAMUSCULAR | Status: AC
  Filled 2018-01-19: qty 6

## 2018-01-19 MED ORDER — OXYCODONE HCL 5 MG PO TABS: 5.0000 mg | ORAL_TABLET | Freq: Once | ORAL | Status: DC | PRN

## 2018-01-19 MED ORDER — SUGAMMADEX SODIUM 200 MG/2ML IV SOLN
INTRAVENOUS | Status: DC | PRN
  Administered 2018-01-19: 200 mg via INTRAVENOUS

## 2018-01-19 MED ORDER — ACETAMINOPHEN 650 MG RE SUPP
650.0000 mg | RECTAL | Status: DC | PRN
  Filled 2018-01-19: qty 1

## 2018-01-19 MED ORDER — MIDAZOLAM HCL 2 MG/2ML IJ SOLN
INTRAMUSCULAR | Status: AC
  Filled 2018-01-19: qty 2

## 2018-01-19 MED ORDER — FENTANYL CITRATE (PF) 100 MCG/2ML IJ SOLN
25.0000 ug | INTRAMUSCULAR | Status: DC | PRN
  Administered 2018-01-19: 50 ug via INTRAVENOUS
  Administered 2018-01-19 (×2): 25 ug via INTRAVENOUS
  Administered 2018-01-19: 50 ug via INTRAVENOUS

## 2018-01-19 MED ORDER — KETOROLAC TROMETHAMINE 30 MG/ML IJ SOLN
30.0000 mg | Freq: Once | INTRAMUSCULAR | Status: AC
  Administered 2018-01-19: 30 mg via INTRAVENOUS

## 2018-01-19 MED ORDER — ACETAMINOPHEN 325 MG PO TABS: 650.0000 mg | ORAL_TABLET | ORAL | Status: DC | PRN

## 2018-01-19 MED ORDER — FENTANYL CITRATE (PF) 100 MCG/2ML IJ SOLN
INTRAMUSCULAR | Status: DC | PRN
  Administered 2018-01-19 (×4): 50 ug via INTRAVENOUS

## 2018-01-19 MED ORDER — PROPOFOL 10 MG/ML IV BOLUS
INTRAVENOUS | Status: AC
  Filled 2018-01-19: qty 40

## 2018-01-19 MED ORDER — CLINDAMYCIN PHOSPHATE 900 MG/50ML IV SOLN
INTRAVENOUS | Status: AC
  Filled 2018-01-19: qty 50

## 2018-01-19 MED ORDER — PROPOFOL 10 MG/ML IV BOLUS
INTRAVENOUS | Status: DC | PRN
  Administered 2018-01-19: 190 mg via INTRAVENOUS

## 2018-01-19 MED ORDER — SODIUM CHLORIDE 0.9% FLUSH: 3.0000 mL | INTRAVENOUS | Status: DC | PRN

## 2018-01-19 MED ORDER — DEXAMETHASONE SODIUM PHOSPHATE 10 MG/ML IJ SOLN
INTRAMUSCULAR | Status: DC | PRN
  Administered 2018-01-19: 10 mg via INTRAVENOUS

## 2018-01-19 MED ORDER — CLINDAMYCIN PHOSPHATE 900 MG/50ML IV SOLN
INTRAVENOUS | Status: DC | PRN
  Administered 2018-01-19: 900 mg via INTRAVENOUS

## 2018-01-19 MED ORDER — SUGAMMADEX SODIUM 200 MG/2ML IV SOLN
INTRAVENOUS | Status: AC
  Filled 2018-01-19: qty 2

## 2018-01-19 MED ORDER — ONDANSETRON HCL 4 MG/2ML IJ SOLN
INTRAMUSCULAR | Status: DC | PRN
  Administered 2018-01-19: 4 mg via INTRAVENOUS

## 2018-01-19 MED ORDER — MIDAZOLAM HCL 5 MG/5ML IJ SOLN
INTRAMUSCULAR | Status: DC | PRN
  Administered 2018-01-19: 2 mg via INTRAVENOUS

## 2018-01-19 MED ORDER — BUPIVACAINE HCL 0.25 % IJ SOLN
INTRAMUSCULAR | Status: DC | PRN
  Administered 2018-01-19: 20 mL

## 2018-01-19 MED ORDER — ROCURONIUM BROMIDE 100 MG/10ML IV SOLN
INTRAVENOUS | Status: AC
  Filled 2018-01-19: qty 3

## 2018-01-19 MED ORDER — TRAMADOL HCL 50 MG PO TABS: 50.0000 mg | ORAL_TABLET | Freq: Four times a day (QID) | ORAL | 0 refills | Status: DC | PRN

## 2018-01-19 MED ORDER — LACTATED RINGERS IR SOLN
Status: DC | PRN
  Administered 2018-01-19: 1000 mL

## 2018-01-19 MED ORDER — EPHEDRINE SULFATE-NACL 50-0.9 MG/10ML-% IV SOSY
PREFILLED_SYRINGE | INTRAVENOUS | Status: DC | PRN
  Administered 2018-01-19: 10 mg via INTRAVENOUS

## 2018-01-19 MED ORDER — STERILE WATER FOR IRRIGATION IR SOLN
Status: DC | PRN
  Administered 2018-01-19: 1000 mL

## 2018-01-19 MED ORDER — ROCURONIUM BROMIDE 10 MG/ML (PF) SYRINGE
PREFILLED_SYRINGE | INTRAVENOUS | Status: DC | PRN
  Administered 2018-01-19: 50 mg via INTRAVENOUS
  Administered 2018-01-19 (×2): 10 mg via INTRAVENOUS

## 2018-01-19 MED ORDER — FENTANYL CITRATE (PF) 100 MCG/2ML IJ SOLN
INTRAMUSCULAR | Status: AC
  Filled 2018-01-19: qty 4

## 2018-01-19 MED ORDER — LACTATED RINGERS IV SOLN
INTRAVENOUS | Status: DC | PRN
  Administered 2018-01-19: 07:00:00 via INTRAVENOUS

## 2018-01-19 MED ORDER — DEXAMETHASONE SODIUM PHOSPHATE 4 MG/ML IJ SOLN: 4.0000 mg | INTRAMUSCULAR | Status: DC

## 2018-01-19 MED ORDER — KETOROLAC TROMETHAMINE 30 MG/ML IJ SOLN
INTRAMUSCULAR | Status: AC
  Administered 2018-01-19: 30 mg via INTRAVENOUS
  Filled 2018-01-19: qty 1

## 2018-01-19 MED ORDER — CELECOXIB 200 MG PO CAPS
400.0000 mg | ORAL_CAPSULE | ORAL | Status: DC
  Filled 2018-01-19: qty 2

## 2018-01-19 MED ORDER — OXYCODONE HCL 5 MG/5ML PO SOLN
5.0000 mg | Freq: Once | ORAL | Status: DC | PRN
  Filled 2018-01-19: qty 5

## 2018-01-19 SURGICAL SUPPLY — 48 items
APPLICATOR SURGIFLO ENDO (HEMOSTASIS) IMPLANT
BAG LAPAROSCOPIC 12 15 PORT 16 (BASKET) IMPLANT
BAG RETRIEVAL 12/15 (BASKET)
COVER BACK TABLE 60X90IN (DRAPES) ×2 IMPLANT
COVER TIP SHEARS 8 DVNC (MISCELLANEOUS) IMPLANT
COVER TIP SHEARS 8MM DA VINCI (MISCELLANEOUS)
COVER WAND RF STERILE (DRAPES) ×2 IMPLANT
DERMABOND ADVANCED (GAUZE/BANDAGES/DRESSINGS) ×1
DERMABOND ADVANCED .7 DNX12 (GAUZE/BANDAGES/DRESSINGS) ×1 IMPLANT
DRAPE ARM DVNC X/XI (DISPOSABLE) ×4 IMPLANT
DRAPE COLUMN DVNC XI (DISPOSABLE) ×1 IMPLANT
DRAPE DA VINCI XI ARM (DISPOSABLE) ×4
DRAPE DA VINCI XI COLUMN (DISPOSABLE) ×1
DRAPE SHEET LG 3/4 BI-LAMINATE (DRAPES) ×4 IMPLANT
DRAPE SURG IRRIG POUCH 19X23 (DRAPES) ×2 IMPLANT
ELECT REM PT RETURN 15FT ADLT (MISCELLANEOUS) ×2 IMPLANT
GLOVE BIO SURGEON STRL SZ 6 (GLOVE) ×8 IMPLANT
GLOVE BIO SURGEON STRL SZ 6.5 (GLOVE) ×4 IMPLANT
GOWN STRL REUS W/ TWL LRG LVL3 (GOWN DISPOSABLE) ×2 IMPLANT
GOWN STRL REUS W/TWL LRG LVL3 (GOWN DISPOSABLE) ×2
HOLDER FOLEY CATH W/STRAP (MISCELLANEOUS) ×2 IMPLANT
IRRIG SUCT STRYKERFLOW 2 WTIP (MISCELLANEOUS) ×2
IRRIGATION SUCT STRKRFLW 2 WTP (MISCELLANEOUS) ×1 IMPLANT
KIT PROCEDURE DA VINCI SI (MISCELLANEOUS)
KIT PROCEDURE DVNC SI (MISCELLANEOUS) IMPLANT
MANIPULATOR UTERINE 4.5 ZUMI (MISCELLANEOUS) ×2 IMPLANT
NEEDLE SPNL 18GX3.5 QUINCKE PK (NEEDLE) IMPLANT
OBTURATOR OPTICAL STANDARD 8MM (TROCAR) ×1
OBTURATOR OPTICAL STND 8 DVNC (TROCAR) ×1
OBTURATOR OPTICALSTD 8 DVNC (TROCAR) ×1 IMPLANT
PACK ROBOT GYN CUSTOM WL (TRAY / TRAY PROCEDURE) ×2 IMPLANT
PAD POSITIONING PINK XL (MISCELLANEOUS) ×2 IMPLANT
PORT ACCESS TROCAR AIRSEAL 12 (TROCAR) ×1 IMPLANT
PORT ACCESS TROCAR AIRSEAL 5M (TROCAR) ×1
POUCH SPECIMEN RETRIEVAL 10MM (ENDOMECHANICALS) IMPLANT
SEAL CANN UNIV 5-8 DVNC XI (MISCELLANEOUS) ×4 IMPLANT
SEAL XI 5MM-8MM UNIVERSAL (MISCELLANEOUS) ×4
SET TRI-LUMEN FLTR TB AIRSEAL (TUBING) ×2 IMPLANT
SURGIFLO W/THROMBIN 8M KIT (HEMOSTASIS) IMPLANT
SUT MNCRL AB 4-0 PS2 18 (SUTURE) ×4 IMPLANT
SUT VIC AB 0 CT1 27 (SUTURE)
SUT VIC AB 0 CT1 27XBRD ANTBC (SUTURE) IMPLANT
SYR 10ML LL (SYRINGE) IMPLANT
TOWEL OR NON WOVEN STRL DISP B (DISPOSABLE) ×2 IMPLANT
TRAP SPECIMEN MUCOUS 40CC (MISCELLANEOUS) IMPLANT
TRAY FOLEY MTR SLVR 16FR STAT (SET/KITS/TRAYS/PACK) ×2 IMPLANT
UNDERPAD 30X30 (UNDERPADS AND DIAPERS) ×2 IMPLANT
WATER STERILE IRR 1000ML POUR (IV SOLUTION) ×2 IMPLANT

## 2018-01-19 NOTE — Transfer of Care (Signed)
Immediate Anesthesia Transfer of Care Note  Patient: Sheila Butler  Procedure(s) Performed: Procedure(s): XI ROBOTIC ASSISTED TOTAL HYSTERECTOMY WITH BILATERAL SALPINGECTOMY (Bilateral)  Patient Location: PACU  Anesthesia Type:General  Level of Consciousness:  sedated, patient cooperative and responds to stimulation  Airway & Oxygen Therapy:Patient Spontanous Breathing and Patient connected to face mask oxgen  Post-op Assessment:  Report given to PACU RN and Post -op Vital signs reviewed and stable  Post vital signs:  Reviewed and stable  Last Vitals:  Vitals:   01/19/18 0546  BP: 108/66  Pulse: (!) 56  Resp: 18  Temp: 36.8 C  SpO2: 383%    Complications: No apparent anesthesia complications

## 2018-01-19 NOTE — Anesthesia Postprocedure Evaluation (Signed)
Anesthesia Post Note  Patient: Sheila Butler  Procedure(s) Performed: XI ROBOTIC ASSISTED TOTAL HYSTERECTOMY WITH BILATERAL SALPINGECTOMY (Bilateral Abdomen)     Patient location during evaluation: PACU Anesthesia Type: General Level of consciousness: awake and alert Pain management: pain level controlled Vital Signs Assessment: post-procedure vital signs reviewed and stable Respiratory status: spontaneous breathing, nonlabored ventilation and respiratory function stable Cardiovascular status: blood pressure returned to baseline and stable Postop Assessment: no apparent nausea or vomiting Anesthetic complications: no    Last Vitals:  Vitals:   01/19/18 1215 01/19/18 1407  BP: (!) 94/57 99/77  Pulse: (!) 58 (!) 59  Resp: 16 17  Temp: 36.4 C 36.6 C  SpO2: 100% 100%    Last Pain:  Vitals:   01/19/18 1407  TempSrc:   PainSc: 3                  Lidia Collum

## 2018-01-19 NOTE — H&P (Signed)
H&P Note: Gyn-Onc  Consult was requested by Dr. Nile Dear for the evaluation of Sheila Butler 57 y.o. female  CC:  Complex hyperplasia of the endometrium without atypia.   Assessment/Plan:  Sheila. Sheila Butler  is a 57 y.o.  year old with complex endometrial hyperplasia without atypia.  I had a discussion with Sheila Butler that the options are hysterectomy with bilateral salpingectomy (+/- oophorectomy), or progestin therapy (IUD vs oral) with resampling at 3 months.  I explained the definitive nature of hysterectomy, and explained that this would ensure no occult cancers were missed. I explained that the risk of this developing into cancer was likely 3% if no atypia is present, 30% if atypia present. I explained that there is a risk for occult cancer adjacent to the blind biopsy site.  I explained hysterectomy risks and recoveries. I explained that it would be reasonable to preserve ovaries at her age.  She has a father with colon cancer history and no other risk factors for endometrial hyperplasia, and therefore Lynch syndrome is certainly a concern, and I explained to the patient that this would increase her future risk for endometrial cancer.  She determined that she would like to undergo robotic hysterectomy with salpingectomy.  HPI: Sheila Butler is a 57 year old woman who has complex endometrial hyperplasia without atypia on biopsy for endometrial bleeding.  She has a history of postmenopausal bleeding that was light and intermittent in 2019 she was seen by Dr. Melba Coon who performed a transvaginal ultrasound scan on 10/25/2017 which revealed a small uterus measuring 5.3 x 5 x 4.05 cm with an endometrial thickness that was mildly enlarged at 5.26 mm.  The left ovary was normal the right ovary contained a 1.8 cm simple cyst.  Due to the thickness of the endometrium she underwent endometrial sampling with a Pipelle biopsy in the office on October 21, 2017.  This revealed complex endometrial hyperplasia  without atypia.  There was also atrophic endometrium with breakdown seen in the specimen.  The patient has a history of normal Pap smears.  She is a very healthy woman.  She takes low-dose gabapentin to help control hot flashes.  She states that she is very sensitive to medications and prefers to avoid having to take medications.  Dr. Melba Coon recommended laparoscopic assisted total hysterectomy BSO.  The patient expresses some concerns about proceeding with hysterectomy and is interested in understanding about all other options.  The patient's family history significant for father with a history of colon cancer in his 20s.  There are no other family history concerning for Lynch syndrome.  She has had a prior appendectomy as a child but no other prior abdominal surgeries.  She has had one prior vaginal delivery.  Current Meds:  Outpatient Encounter Medications as of 12/09/2017  Medication Sig  . Biotin 10 MG CAPS Take 1 capsule by mouth.  . cholecalciferol (VITAMIN D) 1000 units tablet Take 2,000 Units by mouth 3 (three) times daily.   . Cranberry 500 MG TABS Take 1 tablet by mouth daily.  Marland Kitchen gabapentin (NEURONTIN) 100 MG capsule Take 100 mg by mouth daily.  Marland Kitchen OVER THE COUNTER MEDICATION Take 1 tablet by mouth daily. Patient states she takes a probiotic once a day.  . vitamin B-12 (CYANOCOBALAMIN) 100 MCG tablet Take 100 mcg by mouth daily.  . [DISCONTINUED] azelastine (ASTELIN) 0.1 % nasal spray   . [DISCONTINUED] conjugated estrogens (PREMARIN) vaginal cream Place 1 Applicatorful vaginally daily. Use pea sized amount per urethra on  M-W-Fr (Patient not taking: Reported on 12/09/2017)   No facility-administered encounter medications on file as of 12/09/2017.     Allergy:  Allergies  Allergen Reactions  . Codeine Shortness Of Breath, Nausea Only and Other (See Comments)    Dizziness  . Penicillins Itching and Other (See Comments)    Pt has had PCN with eye surgery with NO issues - took PCN  with Cipro- thinks the allergy is Cipro only   . Calcium Other (See Comments)    Patient states that the medication gives her drowsiness  and headaches  . Gluten Meal Other (See Comments)    Patient has a gluten intolerance  . Ciprofloxacin Rash  . Oysters [Shellfish Allergy] Rash    Social Hx:   Social History   Socioeconomic History  . Marital status: Married    Spouse name: Not on file  . Number of children: Not on file  . Years of education: Not on file  . Highest education level: Not on file  Occupational History  . Not on file  Social Needs  . Financial resource strain: Not on file  . Food insecurity:    Worry: Not on file    Inability: Not on file  . Transportation needs:    Medical: Not on file    Non-medical: Not on file  Tobacco Use  . Smoking status: Former Smoker    Years: 7.00    Types: Cigarettes    Last attempt to quit: 08/07/1988    Years since quitting: 29.4  . Smokeless tobacco: Never Used  Substance and Sexual Activity  . Alcohol use: Yes    Alcohol/week: 1.0 standard drinks    Types: 1 Glasses of wine per week    Comment: 1 glass of wine once a week with a meal  . Drug use: Never  . Sexual activity: Not on file  Lifestyle  . Physical activity:    Days per week: Not on file    Minutes per session: Not on file  . Stress: Not on file  Relationships  . Social connections:    Talks on phone: Not on file    Gets together: Not on file    Attends religious service: Not on file    Active member of club or organization: Not on file    Attends meetings of clubs or organizations: Not on file    Relationship status: Not on file  . Intimate partner violence:    Fear of current or ex partner: Not on file    Emotionally abused: Not on file    Physically abused: Not on file    Forced sexual activity: Not on file  Other Topics Concern  . Not on file  Social History Narrative  . Not on file    Past Surgical Hx:  Past Surgical History:  Procedure  Laterality Date  . APPENDECTOMY  age 57  . BLEPHAROPLASTY Bilateral 2018    Past Medical Hx:  Past Medical History:  Diagnosis Date  . Anxiety   . Gluten intolerance   . Hypotension    per pt tends to have lower blood pressure intermittant  . Postmenopausal bleeding   . Wears glasses     Past Gynecological History:  SVD x 1 No LMP recorded. Patient is postmenopausal.  Family Hx:  Family History  Problem Relation Age of Onset  . Non-Hodgkin's lymphoma Mother   . Colon cancer Father   . Heart disease Father   . Parkinson's disease Father   .  Breast cancer Maternal Grandmother   . Colon polyps Neg Hx   . Esophageal cancer Neg Hx   . Stomach cancer Neg Hx   . Rectal cancer Neg Hx     Review of Systems:  Constitutional  Feels well,    ENT Normal appearing ears and nares bilaterally Skin/Breast  No rash, sores, jaundice, itching, dryness Cardiovascular  No chest pain, shortness of breath, or edema  Pulmonary  No cough or wheeze.  Gastro Intestinal  No nausea, vomitting, or diarrhoea. No bright red blood per rectum, no abdominal pain, change in bowel movement, or constipation.  Genito Urinary  No frequency, urgency, dysuria, + bleeding Musculo Skeletal  No myalgia, arthralgia, joint swelling or pain  Neurologic  No weakness, numbness, change in gait,  Psychology  No depression, anxiety, insomnia.   Vitals:  Blood pressure 108/66, pulse (!) 56, temperature 98.2 F (36.8 C), temperature source Oral, resp. rate 18, height 5\' 4"  (1.626 m), weight 154 lb 8 oz (70.1 kg), SpO2 100 %.  Physical Exam: WD in NAD Neck  Supple NROM, without any enlargements.  Lymph Node Survey No cervical supraclavicular or inguinal adenopathy Cardiovascular  Pulse normal rate, regularity and rhythm. S1 and S2 normal.  Lungs  Clear to auscultation bilateraly, without wheezes/crackles/rhonchi. Good air movement.  Skin  No rash/lesions/breakdown  Psychiatry  Alert and oriented to  person, place, and time  Abdomen  Normoactive bowel sounds, abdomen soft, non-tender and thin without evidence of hernia.  Back No CVA tenderness Genito Urinary  Vulva/vagina: Normal external female genitalia.  No lesions. No discharge or bleeding.  Bladder/urethra:  No lesions or masses, well supported bladder  Vagina: normal  Cervix: Normal appearing, no lesions.  Uterus:  Small, mobile, no parametrial involvement or nodularity.  Adnexa: no palpable masses. Rectal  deferred Extremities  No bilateral cyanosis, clubbing or edema.   Thereasa Solo, MD  01/19/2018, 7:05 AM

## 2018-01-19 NOTE — Anesthesia Procedure Notes (Signed)
Procedure Name: Intubation Date/Time: 01/19/2018 7:35 AM Performed by: Lavina Hamman, CRNA Pre-anesthesia Checklist: Patient identified, Emergency Drugs available, Suction available, Patient being monitored and Timeout performed Patient Re-evaluated:Patient Re-evaluated prior to induction Oxygen Delivery Method: Circle system utilized Preoxygenation: Pre-oxygenation with 100% oxygen Induction Type: IV induction Ventilation: Mask ventilation without difficulty Laryngoscope Size: Mac and 3 Grade View: Grade II Tube type: Oral Tube size: 7.0 mm Number of attempts: 1 Airway Equipment and Method: Stylet Placement Confirmation: ETT inserted through vocal cords under direct vision,  positive ETCO2,  CO2 detector and breath sounds checked- equal and bilateral Secured at: 21 cm Tube secured with: Tape Dental Injury: Teeth and Oropharynx as per pre-operative assessment

## 2018-01-19 NOTE — Op Note (Signed)
OPERATIVE NOTE 01/19/18  Surgeon: Donaciano Eva   Assistants: Dr Lahoma Crocker (an MD assistant was necessary for tissue manipulation, management of robotic instrumentation, retraction and positioning due to the complexity of the case and hospital policies).   Anesthesia: General endotracheal anesthesia  ASA Class: 3   Pre-operative Diagnosis: complex hyperplasia of the endometrium, postmenopausal bleeding  Post-operative Diagnosis: same  Operation: Robotic-assisted laparoscopic total hysterectomy with bilateral salpingectomy   Surgeon: Donaciano Eva  Assistant Surgeon: Lahoma Crocker MD  Anesthesia: GET  Urine Output: 200cc  Operative Findings:  : grossly normal uterus and cervix, normal appearing tubes and ovaries. Complex hyperplasia (focal) on frozen section with no malignancy.   Estimated Blood Loss:  <20cc      Total IV Fluids: 800 ml         Specimens: uterus with cervix with bilateral fallopian tubes         Complications:  None; patient tolerated the procedure well.         Disposition: PACU - hemodynamically stable.  Procedure Details  The patient was seen in the Holding Room. The risks, benefits, complications, treatment options, and expected outcomes were discussed with the patient.  The patient concurred with the proposed plan, giving informed consent.  The site of surgery properly noted/marked. The patient was identified as Sheila Butler and the procedure verified as a Robotic-assisted hysterectomy with bilateral salpingectomy. A Time Out was held and the above information confirmed.  After induction of anesthesia, the patient was draped and prepped in the usual sterile manner. Pt was placed in supine position after anesthesia and draped and prepped in the usual sterile manner. The abdominal drape was placed after the CholoraPrep had been allowed to dry for 3 minutes.  Her arms were tucked to her side with all appropriate precautions.  The  shoulders were stabilized with padded shoulder blocks applied to the acromium processes.  The patient was placed in the semi-lithotomy position in [place].  The perineum was prepped with Betadine. The patient was then prepped. Foley catheter was placed.  A sterile speculum was placed in the vagina.  The cervix was grasped with a single-tooth tenaculum and dilated with Kennon Rounds dilators.  The ZUMI uterine manipulator with a medium colpotomizer ring was placed without difficulty.  A pneum occluder balloon was placed over the manipulator.  OG tube placement was confirmed and to suction.   Next, a 5 mm skin incision was made 1 cm below the subcostal margin in the midclavicular line.  The 5 mm Optiview port and scope was used for direct entry.  Opening pressure was under 10 mm CO2.  The abdomen was insufflated and the findings were noted as above.   At this point and all points during the procedure, the patient's intra-abdominal pressure did not exceed 15 mmHg. Next, an 30mm skin incision was made in the umbilicus and a right and left port was placed about 10 cm lateral to the robot port on the right and left side. All ports were placed under direct visualization.  The patient was placed in steep Trendelenburg.  Bowel was folded away into the upper abdomen.  The robot was docked in the normal manner.  The hysterectomy was started after the round ligament on the right side was incised and the retroperitoneum was entered and the pararectal space was developed.  The ureter was noted to be on the medial leaf of the broad ligament.  The peritoneum above the ureter was incised and stretched  and the utero-ovarian ligament was skeletonized, cauterized and cut. The fallopian tubes were dissected off of the underlying ovaries and kept attached to the uterine specimen.   The posterior peritoneum was taken down to the level of the KOH ring.  The anterior peritoneum was also taken down.  The bladder flap was created to the  level of the KOH ring.  The uterine artery on the right side was skeletonized, cauterized and cut in the normal manner.  A similar procedure was performed on the left.  The colpotomy was made and the uterus, cervix, bilateral tubes were amputated and delivered through the vagina.  Pedicles were inspected and excellent hemostasis was achieved.    The colpotomy at the vaginal cuff was closed with Vicryl on a CT1 needle in a running manner.  Irrigation was used and excellent hemostasis was achieved.  At this point in the procedure was completed.  Robotic instruments were removed under direct visulaization.  The robot was undocked. The 10 mm ports were closed with Vicryl on a UR-5 needle and the fascia was closed with 0 Vicryl on a UR-5 needle.  The skin was closed with 4-0 Vicryl in a subcuticular manner.  Dermabond was applied.  Sponge, lap and needle counts correct x 2.  The patient was taken to the recovery room in stable condition.  The vagina was swabbed with  minimal bleeding noted.   All instrument and needle counts were correct x  3.   The patient was transferred to the recovery room in a stable condition.  Donaciano Eva, MD

## 2018-01-19 NOTE — Discharge Instructions (Addendum)
Planning for Recovery and Going Home Your Guide to Gynecologic Surgery     In-Hospital Recovery Plan   Pain Relief After Surgery Your pain will be assessed regularly on a scale from 0 to 10. Pain assessment is  necessary to guide your pain relief. It is essential that you are able to take deep breaths, cough and move. Prevention or early treatment of pain is far more effective than trying to treat severe pain. Therefore, we have devised a specialized regimen to stay ahead of your pain and use almost no narcotics, which can slow down your recovery process. If you have an epidural catheter, you will receive a  constant infusion of pain medication through your epidural. If you need additional pain relief, you will be able to push a button to increase the medication in your epidural. You will also be given acetaminophen and an ibuprofen-like medication to keep your pain under control.  You can always ask for additional pain pills if you are not comfortable. In most cases an anesthesiologist with expertise in pain management will visit you every day and help design your pain management plan.  One Day After Surgery Focus on drinking and walking. You will start drinking clear liquids after surgery. The intravenous fluids will be stopped, and the catheter may be removed  from your bladder. We expect you to get out of bed, with the nurses' or assistants' help, sit in a chair for six hours and start to move about in the hallways. You will also meet with a case manager to assess your discharge needs, including home nursing. Your physician may order home care to assist with your transition home.  Home nursing visits, which are intermittent, help you get readjusted to home by teaching treatments, monitoring medications, and performing clinical assessment and reporting back to your physician. Other services may include therapy and medical equipment; private duty services are also available. If you  are going "home" to a different address upon discharge, please alert [place]. A Home Care Coordinator can visit with you while in the hospital to discuss your options. If you have questions please speak with your case manager. If you need rehabilitation at a facility, a social worker will assist with this. If you need rehabilitation at a facility, a social worker will assist with this. If your procedure was performed in a minimally invasive fashion, you will be discharged to home if your pain is well controlled and you are tolerating a regular diet.     Two Days After Surgery You will start eating a soft diet and change to a more solid diet as you feel up to it. The catheter from your bladder will be removed, if not already done so. If there is a dressing on your wound, it will be removed. The tubing will be disconnected from your IV. We expect you to be out of bed for the majority of the day and walking at least three times in the hallway, with assistance as needed.  You may be discharged at this point if it is felt you are ready.   Three Days After Surgery You continue to eat your low residue diet. You may be ready to go home if you are drinking enough to keep yourself hydrated, your pain is well controlled, you are not belching or nauseated, you are passing gas and you are able to get around on your own. However, we will not discharge you from the hospital until we are sure you are ready.  Discharge Discharge time is at 10 a.m. You will need to make arrangements for someone to accompany you home. You will not be released without someone present. Please keep in mind that we strive to get patients discharged as quickly as possible, but there may be delays for a variety of reasons. Complications That May Delay Discharge: ? Nausea and vomiting: It is very common to feel sick after your surgery. We give you medication to reduce this. However, if you do feel sick, you should reduce the amount you  are taking by mouth. Small, frequent meals or drinks are best in this  situation. As long as you can drink and keep yourself hydrated, the nausea will likely pass.  Ileus: Following surgery, the bowel can be sluggish, making it difficult for food and gas to pass through the intestines. This is called an ileus. We have designed our care program to do everything possible to reduce the likelihood of an ileus. If you do develop an ileus, it usually only lasts two to three days. However, it may require a small tube down the nose to decompress the stomach. The best way to avoid an ileus is to reduce the amount of narcotic pain medications, get up as much as possible after your surgery, and stimulate the bowel early after surgery with small amounts of food and liquids.  Wound infection: If a wound infection develops, this usually happens three to ten days after surgery.    Urinary retention: This is if you are unable to urinate after the catheter from your bladder is removed. The catheter may need to be reinserted until you are able to urinate on your own. This can be caused by anesthesia, pain medication and decreased activity.    When you are preparing to go home, you will receive:  Detailed discharge instructions, with information about your operation and medications    All prescriptions for medications you need at home; prescriptions can be filled while you are in the hospital if you would like    You may be prescribed Lovenox. Lovenox is used to reduce the risk of developing a blood clot after surgery. An appointment to see your surgeon or provider one to two weeks after you leave the hospital for follow-up   After Discharge Once you are discharged: Call us at any time if you are worried about your recovery or if you should have any questions. During regular office hours, (8:30 a.m.-4:30 p.m.), and after hours call [phone].  Call us immediately if:  You have a fever higher  than 100.4 degrees.   Your wound is red, more painful or has drainage.    You are nauseated, vomiting or can't keep liquids down.    Your pain is worse and not able to be controlled with the regimen you were sent home with.    If you are bleeding heavily or have a lot of fluid coming from your vagina. If you are on narcotics, the goal is to wean you off of them. If you are running low on supply and need more, call the nurse a few days before you will run out.  It is generally easier to reach someone between 8:30 a.m. - 4:30 p.m., so call early if you think something is not right. A nurse or nurse practitioner is available every day to answer your questions. After hours and on the weekends, the calls go to the resident doctors in the hospital. It may take longer for your phone call to  be returned during this time. If you have a true emergency, such as severe abdominal pain, chest pain, shortness of breath or any other acute issues, call 911 and go to the local emergency room. Have them contact our team once you are stable.  Concerns After Discharge Bowel Function Following Your Surgery Your bowels will take several weeks to settle down and may be unpredictable at first. Your bowel movements may become loose, or you may be constipated. For the vast number of patients, this will get back to normal with time. Make sure you eat nutritious meals, drink plenty of fluids and take regular walks during the first two weeks after your operation. Your Guide to Gynecologic Surgery    Abdominal Pain It is not unusual to suffer gripping pains (colic) during the first week following removal of a portion of your bowel. This pain usually lasts for a few minutes but goes away between spasms. If you have severe pain lasting more than one to two hours or have a fever and feel generally unwell, you should contact us at  the telephone contact numbers listed at the end of this packet. Hysterectomy: You  should have pelvic rest for six (6) weeks or as specified by your doctor after surgery. You should have nothing in the vagina (no tampons, douching, intercourse, etc.,) during this time period. If you have some vaginal spotting, this is normal. If you have heavy bleeding or  a lot of fluid from your vagina, this is NOT normal and you should contact your doctor's office or, if after hours, contact the doctor on call.  Diarrhea: Fiber and Imodium (Loperamide) The first step to improving your frequent or loose stools is to bulk up the stool with fiber. Metamucil is the most common type of fiber that is available at any drug store. Start with 1 teaspoon mixed into food, like yogurt or oatmeal, in the morning and evening. Try not to drink any fluid for one hour after you take the fiber. This will allow the fiber to act like a sponge in your intestines, soaking up all the excess water. Continue this for three to five days. You may increase by 1 teaspoon every three to five days until the desired affect, or you are at 1 tablespoon (3 teaspoons) twice a day. If this doesn't work, you may try over-the-counter Loperamide, which is an antidiarrheal medication. You may take one tablet in the morning and evening or 30 minutes before you typically have diarrhea. You may take up to eight of these tablets daily. It is best to discuss this with [place] prior to using this medication. If you have continuous diarrhea and abdominal cramping, call [phone].  Foley Catheter Your surgeon may recommend you be discharged home with a foley catheter (bladder catheter) for 1 to 2 weeks. Typically this recommendation will be made for patients undergoing surgery to the lower urinary tract. Before you leave the hospital, your nurse should outfit you with a clip on the inner thigh to secure the catheter to prevent pulling as well as a small bag that can be easily worn on the upper leg under loose fitting pants and skirts.  Your nurse will teach you how to exchange the large bag that typically comes with the catheter for the small bag. You may find it convenient to attach the small bag when active during the day and then the large bag when sleeping at night. If there is ever a point when you notice the catheter  is not draining urine and youbegin to develop pain behind/above the pubic bone, you should report to the clinic or emergency room immediately as the catheter may be kinked or clogged. Kinking or clogging of the catheter prevents urine from draining from your bladder. Urine will quickly build up in the bladder and can cause severe pain as well as seriously disrupt healing if you have undergone surgery on the lower urinary tract. Additionally, pulling on the catheter can result in displacement of the balloon at the end of the catheter from inside of to outside  of the bladder. This also results in severe pain and can cause bleeding. For this reason, secure the catheter to the clip on your inner thigh at all times as the clip prevents against pulling.  Wound Care For the first few weeks following surgery, your wound may be slightly red and uncomfortable. You may shower and let the soapy water wash over your incision. Avoid soaking in the tub for one month following surgery or until the wound is well healed. It will take the wound several months to "soften." It is common to have bumpy areas in the wound near the belly  button and at the ends of the incision.  If you have staples, these should be removed when you are seen by your surgeon at the follow-up appointment. You may have a glue-like material on your incision. Do not pick at this. It will come off over time. It is the surgical glue used in surgery to close your incision. You also have sutures inside of you that will dissolve over time  Post-Surgery Diet Attention to good nutrition after surgery is important to your recovery. If you had no dietary  restrictions prior to the surgery, you will have no special dietary restrictions after the surgery. However, consuming enough protein, calories, vitamins and minerals is necessary to support healing. Some patients find their appetite is less than normal after surgery. In this case, frequent small meals throughout the day may help. It is not uncommon to lose 10 to 15 pounds after surgery. However, by the fourth to fifth week, your weight loss should stabilize. It is normal that certain foods taste different and certain smells may make you nauseas. Over time, the amount you can comfortably consume will gradually increase. You should try to eat a balanced diet, which includes:  Foods that are soft, moist, and easy to chew and swallow    Canned or soft-cooked fruits and vegetables   Plenty of soft breads, rice, pasta, potatoes and other starchy foods (lowerfiber  varieties may be tolerated better initially)    High-protein foods and beverages, such as meats, eggs, milk, cottage cheese  or a supplemental nutrition drink like Boost or Ensure    Drink plenty of fluids-at least 8 to 10 cups per day. This includes water,  fruit juice, Gatorade, teas/coffee and milk. Drinking plenty is especially important if you have loose stools (diarrhea).   Avoid drinking a lot of caffeine, since this may dehydrate you.    Avoid fried, greasy and highly seasoned or spicy foods.    Avoid carbonated beverages in the first couple weeks.    Avoid raw fruits and vegetables.   Hobbies/Activities Walking is encouraged after your surgery. You should plan to undertake regular exercise several times a day and gradually increase this during the four weeks following your operation until you are back to your normal level of activity. You may climb stairs. Don't do any heavy lifting greater  than 10 pounds or contact sports for the first month after your surgery. Generally, you can return to hobbies and activities  soon after your surgery. This will help you recover. It can take up to two to three months to fully recover. It is not unusual to be fatigued and require an afternoon nap for up to six to eight weeks following surgery. Your body is using this energy to heal your wounds. Set small goals for yourself and try to do a little more each day.  Work It is normal to return to work three to six weeks following your operation. If your job involves heavy manual work, then you should wait six weeks. However, you should check with your employer regarding rules, which may be relevant to your return to work. If you need a return-to-work form for your employer or disability papers, bring them to your follow-up appointment or fax them to our office at [phone].  Driving You may drive when you are off narcotics and pain-free enough to react quickly with your braking foot. For most patients, this occurs at one to four weeks following surgery.   Write down any questions you may have to ask your care team.  Important Contact Numbers: GYN Oncology Office: [phone]  General Anesthesia, Adult, Care After These instructions provide you with information about caring for yourself after your procedure. Your health care provider may also give you more specific instructions. Your treatment has been planned according to current medical practices, but problems sometimes occur. Call your health care provider if you have any problems or questions after your procedure. What can I expect after the procedure? After the procedure, it is common to have:  Vomiting.  A sore throat.  Mental slowness.  It is common to feel:  Nauseous.  Cold or shivery.  Sleepy.  Tired.  Sore or achy, even in parts of your body where you did not have surgery.  Follow these instructions at home: For at least 24 hours after the procedure:  Do not: ? Participate in activities where you could fall or become  injured. ? Drive. ? Use heavy machinery. ? Drink alcohol. ? Take sleeping pills or medicines that cause drowsiness. ? Make important decisions or sign legal documents. ? Take care of children on your own.  Rest. Eating and drinking  If you vomit, drink water, juice, or soup when you can drink without vomiting.  Drink enough fluid to keep your urine clear or pale yellow.  Make sure you have little or no nausea before eating solid foods.  Follow the diet recommended by your health care provider. General instructions  Have a responsible adult stay with you until you are awake and alert.  Return to your normal activities as told by your health care provider. Ask your health care provider what activities are safe for you.  Take over-the-counter and prescription medicines only as told by your health care provider.  If you smoke, do not smoke without supervision.  Keep all follow-up visits as told by your health care provider. This is important. Contact a health care provider if:  You continue to have nausea or vomiting at home, and medicines are not helpful.  You cannot drink fluids or start eating again.  You cannot urinate after 8-12 hours.  You develop a skin rash.  You have fever.  You have increasing redness at the site of your procedure. Get help right away if:  You have difficulty breathing.  You have chest  pain.  You have unexpected bleeding.  You feel that you are having a life-threatening or urgent problem. This information is not intended to replace advice given to you by your health care provider. Make sure you discuss any questions you have with your health care provider. Document Released: 05/24/2000 Document Revised: 07/21/2015 Document Reviewed: 01/30/2015 Elsevier Interactive Patient Education  [employer].

## 2018-01-20 ENCOUNTER — Encounter (HOSPITAL_COMMUNITY): Payer: Self-pay | Admitting: Gynecologic Oncology

## 2018-01-20 ENCOUNTER — Telehealth: Payer: Self-pay

## 2018-01-20 NOTE — Telephone Encounter (Signed)
Outgoing call per Joylene John NP regarding "she had surgery yesterday and went home same-day- please see how she is ?  Voiding? "  Pt reports she is "doing well, a little sore." Reports she is voiding well, states was not able to void at hospital but once she got home, she was able to go.  No other needs per pt at this time. Encouraged to call for any questions or concerns. I let her know that we will call her with the f/u appt date and time.

## 2018-01-23 ENCOUNTER — Telehealth: Payer: Self-pay | Admitting: Emergency Medicine

## 2018-01-23 NOTE — Telephone Encounter (Signed)
Called patient to f/u since her surgery. She states she has mild pain controlled with tylenol. Urinating with no issues. Patient reports moving her bowels. Good appetite, still eating small frequent meals.  Path report given to her. 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 She voiced understanding of this and I confirmed her f/u appt w/ her.

## 2018-02-24 ENCOUNTER — Encounter: Payer: Self-pay | Admitting: Gynecologic Oncology

## 2018-02-24 ENCOUNTER — Inpatient Hospital Stay: Payer: No Typology Code available for payment source | Attending: Gynecologic Oncology | Admitting: Gynecologic Oncology

## 2018-02-24 VITALS — BP 116/70 | HR 58 | Temp 98.4°F | Resp 18 | Ht 64.0 in | Wt 153.0 lb

## 2018-02-24 DIAGNOSIS — N8501 Benign endometrial hyperplasia: Secondary | ICD-10-CM

## 2018-02-24 DIAGNOSIS — N8502 Endometrial intraepithelial neoplasia [EIN]: Secondary | ICD-10-CM | POA: Diagnosis not present

## 2018-02-24 DIAGNOSIS — Z90710 Acquired absence of both cervix and uterus: Secondary | ICD-10-CM | POA: Insufficient documentation

## 2018-02-24 DIAGNOSIS — Z90722 Acquired absence of ovaries, bilateral: Secondary | ICD-10-CM | POA: Diagnosis not present

## 2018-02-24 DIAGNOSIS — N952 Postmenopausal atrophic vaginitis: Secondary | ICD-10-CM | POA: Diagnosis not present

## 2018-02-24 DIAGNOSIS — Z1231 Encounter for screening mammogram for malignant neoplasm of breast: Secondary | ICD-10-CM

## 2018-02-24 MED ORDER — ESTROGENS, CONJUGATED 0.625 MG/GM VA CREA: 1.0000 | TOPICAL_CREAM | Freq: Every day | VAGINAL | 12 refills | Status: AC

## 2018-02-24 NOTE — Progress Notes (Signed)
Follow-up Note: Gyn-Onc  Consult was requested by Dr. Nile Dear for the evaluation of Sheila Butler 57 y.o. female  CC:  Chief Complaint  Patient presents with  . Complex endometrial hyperplasia    Assessment/Plan:  Sheila. Sheila Butler  is a 57 y.o.  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  She is doing well postop with no acute issues.  The vaginal cuff is healing slowly - prescribed vaginal premarin for 1 month and counseled to avoid intercourse during that time.    HPI: Sheila Butler is a 57 year old woman who has complex endometrial hyperplasia without atypia on biopsy for endometrial bleeding.  She has a history of postmenopausal bleeding that was light and intermittent in 2019 she was seen by Dr. Melba Butler who performed a transvaginal ultrasound scan on 10/25/2017 which revealed a small uterus measuring 5.3 x 5 x 4.05 cm with an endometrial thickness that was mildly enlarged at 5.26 mm.  The left ovary was normal the right ovary contained a 1.8 cm simple cyst.  Due to the thickness of the endometrium she underwent endometrial sampling with a Pipelle biopsy in the office on October 21, 2017.  This revealed complex endometrial hyperplasia without atypia.  There was also atrophic endometrium with breakdown seen in the specimen.  The patient has a history of normal Pap smears.  She is a very healthy woman.  She takes low-dose gabapentin to help control hot flashes.  She states that she is very sensitive to medications and prefers to avoid having to take medications.  Dr. Melba Butler recommended laparoscopic assisted total hysterectomy BSO.  The patient expresses some concerns about proceeding with hysterectomy and is interested in understanding about all other options.  The patient's family history significant for father with a history of colon cancer in his 30s.  There are no other family history concerning for Lynch  syndrome.  She has had a prior appendectomy as a child but no other prior abdominal surgeries.  She has had one prior vaginal delivery.  Interval Hx:  On January 19, 2018 she underwent a robotic assisted total hysterectomy bilateral salpingectomy.  Final pathology revealed focal complex atypical hyperplasia with no carcinoma identified.  The fallopian tubes and cervix were benign.  The ovaries appeared grossly normal intraoperatively.  Postoperatively she is done well with no complaints.  Current Meds:  Outpatient Encounter Medications as of 02/24/2018  Medication Sig  . acetaminophen (TYLENOL) 500 MG tablet Take 1,000 mg by mouth every 6 (six) hours as needed.  Marland Kitchen b complex vitamins tablet Take 1 tablet by mouth daily.  . Cholecalciferol (VITAMIN D) 50 MCG (2000 UT) CAPS Take 4,000 Units by mouth daily.   . Cranberry 500 MG TABS Take 500 mg by mouth daily.   . Lidocaine 4 % LOTN Apply 1 application topically daily as needed (for pain).  Marland Kitchen OVER THE COUNTER MEDICATION Take 1 capsule by mouth daily with lunch. HCL Prozyme Supplement  . Polyvinyl Alcohol-Povidone PF (REFRESH) 1.4-0.6 % SOLN Place 1 drop into both eyes 4 (four) times daily as needed (for dry eyes).  . Probiotic Product (PROBIOTIC PO) Take 1 capsule by mouth daily.  . vitamin C (ASCORBIC ACID) 500 MG tablet Take 1,000 mg by mouth 2 (two) times daily.  Marland Kitchen conjugated estrogens (PREMARIN) vaginal cream Place 1 Applicatorful vaginally daily for 28 days.  . [DISCONTINUED] traMADol (ULTRAM) 50 MG tablet Take 1 tablet (50 mg total) by mouth every  6 (six) hours as needed.   No facility-administered encounter medications on file as of 02/24/2018.     Allergy:  Allergies  Allergen Reactions  . Codeine Shortness Of Breath, Nausea Only and Other (See Comments)    Dizziness  . Penicillins Itching and Other (See Comments)    Pt has had PCN with eye surgery with NO issues - took PCN with Cipro- thinks the allergy is Cipro only   .  Calcium Other (See Comments)    Patient states that the medication gives her drowsiness  and headaches  . Gluten Meal Other (See Comments)    Patient has a gluten intolerance  . Ciprofloxacin Rash  . Oysters [Shellfish Allergy] Rash    Social Hx:   Social History   Socioeconomic History  . Marital status: Married    Spouse name: Not on file  . Number of children: Not on file  . Years of education: Not on file  . Highest education level: Not on file  Occupational History  . Not on file  Social Needs  . Financial resource strain: Not on file  . Food insecurity:    Worry: Not on file    Inability: Not on file  . Transportation needs:    Medical: Not on file    Non-medical: Not on file  Tobacco Use  . Smoking status: Former Smoker    Years: 7.00    Types: Cigarettes    Last attempt to quit: 08/07/1988    Years since quitting: 29.5  . Smokeless tobacco: Never Used  Substance and Sexual Activity  . Alcohol use: Yes    Alcohol/week: 1.0 standard drinks    Types: 1 Glasses of wine per week    Comment: 1 glass of wine once a week with a meal  . Drug use: Never  . Sexual activity: Not Currently    Birth control/protection: Post-menopausal  Lifestyle  . Physical activity:    Days per week: Not on file    Minutes per session: Not on file  . Stress: Not on file  Relationships  . Social connections:    Talks on phone: Not on file    Gets together: Not on file    Attends religious service: Not on file    Active member of club or organization: Not on file    Attends meetings of clubs or organizations: Not on file    Relationship status: Not on file  . Intimate partner violence:    Fear of current or ex partner: Not on file    Emotionally abused: Not on file    Physically abused: Not on file    Forced sexual activity: Not on file  Other Topics Concern  . Not on file  Social History Narrative  . Not on file    Past Surgical Hx:  Past Surgical History:  Procedure  Laterality Date  . APPENDECTOMY  age 83  . BLEPHAROPLASTY Bilateral 2018  . ROBOTIC ASSISTED TOTAL HYSTERECTOMY WITH BILATERAL SALPINGO OOPHERECTOMY Bilateral 01/19/2018   Procedure: XI ROBOTIC ASSISTED TOTAL HYSTERECTOMY WITH BILATERAL SALPINGECTOMY;  Surgeon: Everitt Amber, MD;  Location: [facility];  Service: Gynecology;  Laterality: Bilateral;    Past Medical Hx:  Past Medical History:  Diagnosis Date  . Anxiety   . Gluten intolerance   . Hypotension    per pt tends to have lower blood pressure intermittant  . Postmenopausal bleeding   . Wears glasses     Past Gynecological History:  SVD x 1 No  LMP recorded. Patient is postmenopausal.  Family Hx:  Family History  Problem Relation Age of Onset  . Non-Hodgkin's lymphoma Mother   . Colon cancer Father   . Heart disease Father   . Parkinson's disease Father   . Breast cancer Maternal Grandmother   . Colon polyps Neg Hx   . Esophageal cancer Neg Hx   . Stomach cancer Neg Hx   . Rectal cancer Neg Hx     Review of Systems:  Constitutional  Feels well,    ENT Normal appearing ears and nares bilaterally Skin/Breast  No rash, sores, jaundice, itching, dryness Cardiovascular  No chest pain, shortness of breath, or edema  Pulmonary  No cough or wheeze.  Gastro Intestinal  No nausea, vomitting, or diarrhoea. No bright red blood per rectum, no abdominal pain, change in bowel movement, or constipation.  Genito Urinary  No frequency, urgency, dysuria, no bleeding Musculo Skeletal  No myalgia, arthralgia, joint swelling or pain  Neurologic  No weakness, numbness, change in gait,  Psychology  No depression, anxiety, insomnia.   Vitals:  Blood pressure 116/70, pulse (!) 58, temperature 98.4 F (36.9 C), temperature source Oral, resp. rate 18, height 5\' 4"  (1.626 m), weight 153 lb (69.4 kg), SpO2 100 %.  Physical Exam: WD in NAD Neck  Supple NROM, without any enlargements.  Lymph Node Survey No cervical supraclavicular or  inguinal adenopathy Cardiovascular  Pulse normal rate, regularity and rhythm. S1 and S2 normal.  Lungs  Clear to auscultation bilateraly, without wheezes/crackles/rhonchi. Good air movement.  Skin  No rash/lesions/breakdown  Psychiatry  Alert and oriented to person, place, and time  Abdomen  Normoactive bowel sounds, abdomen soft, non-tender and thin without evidence of hernia. Well healed incisions.  Back No CVA tenderness Genito Urinary  Vaginal cuff in tact, but with attenuated suture material and mucosal gapping. No bleeding, no dehiscence. Rectal  deferred Extremities  No bilateral cyanosis, clubbing or edema.   Thereasa Solo, MD  02/24/2018, 11:48 AM

## 2018-02-24 NOTE — Patient Instructions (Addendum)
Please refrain from intercourse for 4 more weeks until the top of the vagina has healed. For 1 month please use vaginal premarin nightly to aid in healing at the vaginal cuff.  Dr Denman George has put an order in for mammography screening at [facility].  Dr Denman George recommends Dr Colin Benton at [facility]

## 2018-03-05 ENCOUNTER — Encounter: Payer: Self-pay | Admitting: Gynecologic Oncology

## 2018-03-21 ENCOUNTER — Encounter: Payer: Self-pay | Admitting: Gynecologic Oncology

## 2018-05-26 ENCOUNTER — Ambulatory Visit: Payer: No Typology Code available for payment source

## 2018-06-23 ENCOUNTER — Ambulatory Visit: Payer: No Typology Code available for payment source

## 2018-07-26 ENCOUNTER — Encounter: Payer: Self-pay | Admitting: Gynecologic Oncology

## 2018-07-27 ENCOUNTER — Other Ambulatory Visit: Payer: Self-pay | Admitting: Gynecologic Oncology

## 2018-07-27 DIAGNOSIS — M6289 Other specified disorders of muscle: Secondary | ICD-10-CM

## 2018-08-04 ENCOUNTER — Ambulatory Visit: Payer: No Typology Code available for payment source | Admitting: Physical Therapy

## 2018-08-11 ENCOUNTER — Ambulatory Visit: Payer: Self-pay | Attending: Gynecologic Oncology | Admitting: Physical Therapy

## 2018-08-11 ENCOUNTER — Other Ambulatory Visit: Payer: Self-pay

## 2018-08-11 ENCOUNTER — Encounter: Payer: Self-pay | Admitting: Physical Therapy

## 2018-08-11 DIAGNOSIS — M62838 Other muscle spasm: Secondary | ICD-10-CM | POA: Insufficient documentation

## 2018-08-11 DIAGNOSIS — M6281 Muscle weakness (generalized): Secondary | ICD-10-CM | POA: Insufficient documentation

## 2018-08-11 NOTE — Therapy (Addendum)
[facility] [address] [place], [place], [address] Phone: [phone]   Fax:  [phone]  Physical Therapy Evaluation  Patient Details  Name: Sheila Butler MRN: [id number] Date of Birth: September 01, 1960 Referring Provider (PT): Joylene John D   Encounter Date: 08/11/2018  PT End of Session - 08/11/18 1348    Visit Number  1    Date for PT Re-Evaluation  08/11/18    PT Start Time  1331    PT Stop Time  1420    PT Time Calculation (min)  49 min    Activity Tolerance  Patient tolerated treatment well    Behavior During Therapy  [facility] for tasks assessed/performed       Past Medical History:  Diagnosis Date  . Anxiety   . Gluten intolerance   . Hypotension    per pt tends to have lower blood pressure intermittant  . Postmenopausal bleeding   . Wears glasses     Past Surgical History:  Procedure Laterality Date  . APPENDECTOMY  age 47  . BLEPHAROPLASTY Bilateral 2018  . ROBOTIC ASSISTED TOTAL HYSTERECTOMY WITH BILATERAL SALPINGO OOPHERECTOMY Bilateral 01/19/2018   Procedure: XI ROBOTIC ASSISTED TOTAL HYSTERECTOMY WITH BILATERAL SALPINGECTOMY;  Surgeon: Everitt Amber, MD;  Location: [facility];  Service: Gynecology;  Laterality: Bilateral;    There were no vitals filed for this visit.   Subjective Assessment - 08/11/18 1333    Subjective  Pt states she has had very bad pain with intercourse.  Now it comes sometimes when standing or walking. Pt states the pain is deep in the vagina    How long can you walk comfortably?  intense exercise program 3-4 days/week    Patient Stated Goals  have a normal sex life with spouse    Currently in Pain?  Yes    Pain Score  6     Pain Location  Vagina    Pain Orientation  Mid;Other (Comment)   deep   Pain Descriptors / Indicators  Sharp;Shooting    Pain Type  Chronic pain;Other (Comment)   March 2020   Pain Onset  More than a month ago    Pain Frequency  Intermittent    Aggravating Factors   intercourse    Pain Relieving Factors  goes aways    Multiple Pain Sites  No         [facility] PT Assessment - 08/11/18 0001      Assessment   Medical Diagnosis  M62.89 (ICD-10-CM) - Pelvic floor dysfunction in female    Referring Provider (PT)  Joylene John D    Onset Date/Surgical Date  01/10/18    Prior Therapy  No      Precautions   Precautions  None      Restrictions   Weight Bearing Restrictions  No      Balance Screen   Has the patient fallen in the past 6 months  No      [place] residence    Living Arrangements  Spouse/significant other;Children      Prior Function   Level of Independence  Independent    Vocation  Full time employment      Cognition   Overall Cognitive Status  Within Functional Limits for tasks assessed      Posture/Postural Control   Posture/Postural Control  Postural limitations    Postural Limitations  Increased lumbar lordosis      ROM / Strength  AROM / PROM / Strength  AROM;PROM;Strength      AROM   Overall AROM Comments  lumbar flexion - touch toes, but using mostly hamstring flexibility - no flexion in lumbar spine      PROM   Overall PROM Comments  hip IR left side - 50% limited      Strength   Overall Strength Comments  5/5 except hip flexion Rt LE 4/5      Flexibility   Soft Tissue Assessment /Muscle Length  yes    Hamstrings  WNL      Palpation   SI assessment   normal    Palpation comment  lumbar paraspinals and upper left quadrant of abdomen tight and restricted      Ambulation/Gait   Gait Pattern  Within Functional Limits                Objective measurements completed on examination: See above findings.    Pelvic Floor Special Questions - 08/11/18 0001    Prior Pelvic/Prostate Exam  Yes    Prior Pregnancies  Yes    Number of Pregnancies  1    Number of Vaginal Deliveries  1    Currently Sexually Active  Yes    Is this Painful  Yes     Marinoff Scale  pain interrupts completion    Fecal incontinence  No    Fluid intake  plenty of water    Falling out feeling (prolapse)  No    Skin Integrity  Intact    Pelvic Floor Internal Exam  pt identity confirmed and informed consent given to treat and assess internal soft tissue    Exam Type  Vaginal    Palpation  tight and tender to palpation obdurator internus and levator ani muscles bilaterally    Strength  weak squeeze, no lift   got stronger after several attempts   Strength # of seconds  4    Tone  high       [facility] Adult PT Treatment/Exercise - 08/11/18 0001      Self-Care   Self-Care  Other Self-Care Comments    Other Self-Care Comments   moisturizers, lubricants, self massage             PT Education - 08/11/18 1644    Education Details  moisturizers, lubricants, self massage    Person(s) Educated  Patient    Methods  Explanation;Verbal cues;Handout    Comprehension  Verbalized understanding       PT Short Term Goals - 08/11/18 1422      PT SHORT TERM GOAL #1   Title  pt will be ind with self massage techniques    Time  3    Period  Weeks    Status  New    Target Date  09/01/18        PT Long Term Goals - 08/11/18 1423      PT LONG TERM GOAL #1   Title  Pt will be ind with HEP to maintain healthy pelvic floor strength and elasticity    Time  12    Period  Weeks    Status  New    Target Date  11/03/18      PT LONG TERM GOAL #2   Title  Pt will be able to verbalize at least 2 ways to adjust positions or techniques if she becomes uncomfortable during intercourse    Time  12    Period  Weeks  Status  New    Target Date  11/03/18      PT LONG TERM GOAL #3   Title  Pt will be able to walk and stand for 1-2 hours at a time without any pain    Time  12    Period  Weeks    Status  New    Target Date  11/03/18      PT LONG TERM GOAL #4   Title  Pt will report 0/3 Marinoff scale    Time  12    Period  Weeks    Status  New    Target  Date  11/03/18             Plan - 08/11/18 1427    Clinical Impression Statement  Pt presents to skilled PT s/p total hysterectomy.  She has 2/3 pain on marinoff scale at this time with intercourse.  Pt has tight and tender obdurator internus muscles bilaterally.  Increased abdominal fascial restrictions.  She has pelvic floor and hip weakness as mentioned above.  Pt has some decreased hip IR ROM on the left LE.  Pt has decreased lumbar flexion ROM and tight lumbar paraspinals.  Standing posture demonstrates increased lumbar lordosis.  Pt will benefit from skilled PT to address impairments so she can return to acitvities of healthy lifestyle.    Personal Factors and Comorbidities  Comorbidity 1    Comorbidities  hysterectomy    Examination-Participation Restrictions  Interpersonal Relationship    Stability/Clinical Decision Making  Evolving/Moderate complexity    Clinical Decision Making  Moderate    Rehab Potential  Excellent    PT Frequency  1x / week    PT Duration  12 weeks   she will have to space out appointments due to distance from clinic   PT Treatment/Interventions  ADLs/Self Care Home Management;Biofeedback;Cryotherapy;Electrical Stimulation;Moist Heat;Therapeutic activities;Therapeutic exercise;Neuromuscular re-education;Patient/family education;Manual techniques;Taping;Dry needling;Passive range of motion    PT Next Visit Plan  lumbar and abdominal soft tissue release, pelvic floor stretch and breathing technique, biofeedback    PT Home Exercise Plan  moisurizors, self massage    Consulted and Agree with Plan of Care  Patient       Patient will benefit from skilled therapeutic intervention in order to improve the following deficits and impairments:  Pain, Postural dysfunction, Difficulty walking, Increased muscle spasms, Decreased strength, Increased fascial restricitons, Impaired tone, Decreased range of motion  Visit Diagnosis: Muscle weakness (generalized)  Other  muscle spasm     Problem List Patient Active Problem List   Diagnosis Date Noted  . Complex endometrial hyperplasia without atypia 01/19/2018  . Complaining of back-related symptom 06/09/2011  . Lumbar canal stenosis 06/09/2011    Jule Ser, PT 08/11/2018, 4:50 PM  [facility] [address], [place], [place], [address] Phone: [phone]   Fax:  [phone]  Name: Sheila Butler MRN: [id number] Date of Birth: 1961/02/21  PHYSICAL THERAPY DISCHARGE SUMMARY  Visits from Start of Care: 1  Current functional level related to goals / functional outcomes: See above, only 1 visit   Remaining deficits: See above    Education / Equipment: HEP as seen in chart  Plan: Patient agrees to discharge.  Patient goals were not met. Patient is being discharged due to the patient's request.  ?????     Gustavus Bryant, PT 08/14/18 5:00 PM

## 2018-08-11 NOTE — Patient Instructions (Addendum)
Moisturizers . They are used in the vagina to hydrate the mucous membrane that make up the vaginal canal. . Designed to keep a more normal acid balance (ph) . Once placed in the vagina, it will last between two to three days.  . Use 2-3 times per week at bedtime and last longer than 60 min. . Ingredients to avoid is glycerin and fragrance, can increase chance of infection . Should not be used just before sex due to causing irritation . Most are gels administered either in a tampon-shaped applicator or as a vaginal suppository. They are non-hormonal.   Types of Moisturizers . Samul Dada- drug store . Vitamin E vaginal suppositories- Whole foods, Amazon . Moist Again . Coconut oil- can break down condoms . Julva- (Do no use if on Tamoxifen) amazon . Yes moisturizer- amazon . NeuEve Silk , NeuEve Silver for menopausal or over 65 (if have severe vaginal atrophy or cancer treatments use NeuEve Silk for  1 month than move to The Pepsi)- [employer], MapleFlower.dk . Olive and Bee intimate cream- www.oliveandbee.com.au . Mae vaginal moisturizer- Amazon  Creams to use externally on the Vulva area  Albertson's (good for for cancer patients that had radiation to the area)- [place] or [employer] Corporation.FlyingBasics.com.br  V-magic cream - amazon  Julva-amazon  Vital "V [place] salve ( help moisturize and help with thinning vulvar area, does have [place] by Damiva labial moisturizer (Strang,    Things to avoid in the vaginal area . Do not use things to irritate the vulvar area . No lotions just specialized creams for the vulva area- Neogyn, V-magic, No soaps; can use Aveeno or Calendula cleanser if needed. Must be gentle . No deodorants . No douches . Good to sleep without underwear to let the vaginal area to air out . No scrubbing: spread the lips to let warm water rinse over labias and pat dry   Lubrication . Used  for intercourse to reduce friction . Avoid ones that have glycerin, warming gels, tingling gels, icing or cooling gel, scented . May need to be reapplied once or several times during sexual activity . Can be applied to both partners genitals prior to vaginal penetration to minimize friction or irritation . Prevent irritation and mucosal tears that cause post coital pain and increased the risk of vaginal and urinary tract infections . Oil-based lubricants cannot be used with condoms due to breaking them down.  Least likely to irritate vaginal tissue.  . Plant based-lubes are safe . Silicone-based lubrication are thicker and last long and used for post-menopausal women Types of Lubricants . Good Clean Love (water based)-[employer], Target, Walmart, CVS . Slippery Stuff(water based) [employer] . Sylk (water based) [employer], [place]- drug store; www.blossom-organics.com . Samul Dada- Drug store . Coconut oil- will breakdown condoms, least irritating . Aloe Vera- least irritating . Sliquid Natural H20 (water based)-Walgreen's, good if frequent UTI's . Wet Platinum- (Silicone) Target, Walgreen's . Yes [place] . [place] Jelly, Replens, and Astroglide kills good Bacteria (lactobadilli)  Things to avoid in the vaginal area . Do not use things to irritate the vulvar area . No lotions . No soaps; can use Aveeno or Calendula cleanser if needed. Must be gentle . No deodorants . No douches . Good to sleep without underwear to let the vaginal area to air out . No scrubbing: spread the lips to let warm water rinse over labias and  pat dry  STRETCHING THE PELVIC FLOOR MUSCLES NO DILATOR  Supplies . Vaginal lubricant . Mirror (optional) . Gloves (optional) Positioning . Start in a semi-reclined position with your head propped up. Bend your knees and place your thumb or finger at the vaginal opening. Procedure . Apply a moderate amount of lubricant on the outer skin of your vagina, the  labia minora.  Apply additional lubricant to your finger. Marland Kitchen Spread the skin away from the vaginal opening. Place the end of your finger at the opening. . Do a maximum contraction of the pelvic floor muscles. Tighten the vagina and the anus maximally and relax. . When you know they are relaxed, gently and slowly insert your finger into your vagina, directing your finger slightly downward, for 2-3 inches of insertion. . Relax and stretch the 6 o'clock position . Hold each stretch for _10 seconds__ and repeat __1_ time with rest breaks of _1__ seconds between each stretch. . Repeat the stretching in the 4 o'clock and 8 o'clock positions. . Total time should be _6__ minutes, _1__ x per day.  Note the amount of theme your were able to achieve and your tolerance to your finger in your vagina. . Once you have accomplished the techniques you may try them in standing with one foot resting on the tub, or in other positions.  This is a good stretch to do in the shower if you don't need to use lubricant.

## 2018-08-11 NOTE — Addendum Note (Signed)
Addended by: Su Hoff on: 08/11/2018 04:57 PM   Modules accepted: Orders

## 2018-08-18 ENCOUNTER — Ambulatory Visit
Admission: RE | Admit: 2018-08-18 | Discharge: 2018-08-18 | Disposition: A | Discharge status: Home / Self Care | Payer: No Typology Code available for payment source | Source: Ambulatory Visit | Attending: Gynecologic Oncology | Admitting: Gynecologic Oncology

## 2018-08-18 ENCOUNTER — Other Ambulatory Visit: Payer: Self-pay

## 2018-08-18 DIAGNOSIS — Z1231 Encounter for screening mammogram for malignant neoplasm of breast: Secondary | ICD-10-CM

## 2018-08-22 ENCOUNTER — Encounter: Payer: Self-pay | Admitting: Physical Therapy

## 2018-08-22 ENCOUNTER — Other Ambulatory Visit: Payer: Self-pay | Admitting: Gynecologic Oncology

## 2018-08-22 DIAGNOSIS — R928 Other abnormal and inconclusive findings on diagnostic imaging of breast: Secondary | ICD-10-CM

## 2018-08-30 HISTORY — PX: BREAST BIOPSY: SHX20

## 2018-08-31 ENCOUNTER — Ambulatory Visit
Admission: RE | Admit: 2018-08-31 | Discharge: 2018-08-31 | Disposition: A | Discharge status: Home / Self Care | Payer: No Typology Code available for payment source | Source: Ambulatory Visit | Attending: Gynecologic Oncology | Admitting: Gynecologic Oncology

## 2018-08-31 ENCOUNTER — Other Ambulatory Visit: Payer: Self-pay

## 2018-08-31 ENCOUNTER — Other Ambulatory Visit: Payer: Self-pay | Admitting: Gynecologic Oncology

## 2018-08-31 DIAGNOSIS — R928 Other abnormal and inconclusive findings on diagnostic imaging of breast: Secondary | ICD-10-CM

## 2018-09-05 ENCOUNTER — Ambulatory Visit
Admission: RE | Admit: 2018-09-05 | Discharge: 2018-09-05 | Disposition: A | Discharge status: Home / Self Care | Payer: No Typology Code available for payment source | Source: Ambulatory Visit | Attending: Gynecologic Oncology | Admitting: Gynecologic Oncology

## 2018-09-05 ENCOUNTER — Other Ambulatory Visit: Payer: Self-pay

## 2018-09-05 DIAGNOSIS — R928 Other abnormal and inconclusive findings on diagnostic imaging of breast: Secondary | ICD-10-CM

## 2018-09-12 ENCOUNTER — Other Ambulatory Visit: Payer: Self-pay | Admitting: Internal Medicine

## 2018-09-12 DIAGNOSIS — N6310 Unspecified lump in the right breast, unspecified quadrant: Secondary | ICD-10-CM

## 2018-12-22 ENCOUNTER — Encounter: Payer: Self-pay | Admitting: Gastroenterology

## 2019-01-31 ENCOUNTER — Encounter: Payer: Self-pay | Admitting: Gastroenterology

## 2019-02-07 ENCOUNTER — Other Ambulatory Visit: Payer: Self-pay | Admitting: Gynecologic Oncology

## 2019-02-07 DIAGNOSIS — N6310 Unspecified lump in the right breast, unspecified quadrant: Secondary | ICD-10-CM

## 2019-02-09 ENCOUNTER — Ambulatory Visit
Admission: RE | Admit: 2019-02-09 | Discharge: 2019-02-09 | Disposition: A | Discharge status: Home / Self Care | Payer: No Typology Code available for payment source | Source: Ambulatory Visit | Attending: Internal Medicine | Admitting: Internal Medicine

## 2019-02-09 ENCOUNTER — Ambulatory Visit: Admission: RE | Admit: 2019-02-09 | Payer: No Typology Code available for payment source | Source: Ambulatory Visit

## 2019-02-09 ENCOUNTER — Other Ambulatory Visit: Payer: Self-pay | Admitting: Gynecologic Oncology

## 2019-02-09 ENCOUNTER — Other Ambulatory Visit: Payer: Self-pay

## 2019-02-09 DIAGNOSIS — N6310 Unspecified lump in the right breast, unspecified quadrant: Secondary | ICD-10-CM

## 2019-03-30 ENCOUNTER — Encounter: Payer: No Typology Code available for payment source | Admitting: Gastroenterology

## 2019-05-31 ENCOUNTER — Other Ambulatory Visit: Payer: Self-pay

## 2019-05-31 ENCOUNTER — Ambulatory Visit (AMBULATORY_SURGERY_CENTER): Payer: Self-pay | Admitting: *Deleted

## 2019-05-31 VITALS — Temp 97.1°F | Ht 64.0 in | Wt 154.0 lb

## 2019-05-31 DIAGNOSIS — Z800 Family history of malignant neoplasm of digestive organs: Secondary | ICD-10-CM

## 2019-05-31 DIAGNOSIS — Z86010 Personal history of colonic polyps: Secondary | ICD-10-CM

## 2019-05-31 DIAGNOSIS — Z01818 Encounter for other preprocedural examination: Secondary | ICD-10-CM

## 2019-05-31 MED ORDER — NA SULFATE-K SULFATE-MG SULF 17.5-3.13-1.6 GM/177ML PO SOLN: ORAL | 0 refills | Status: DC

## 2019-05-31 NOTE — Progress Notes (Signed)
Patient is here in-person for PV. Patient denies any allergies to eggs or soy. Patient denies any problems with anesthesia/sedation. Patient denies any oxygen use at home. Patient denies taking any diet/weight loss medications or blood thinners. Patient is not being treated for MRSA or C-diff. COVID-19 screening test is on 4/13 self pay, the pt is aware.  Patient is aware of our care-partner policy and 0000000 safety protocol.   Prep Prescription coupon given to the patient.

## 2019-06-12 ENCOUNTER — Other Ambulatory Visit (HOSPITAL_COMMUNITY): Payer: No Typology Code available for payment source

## 2019-06-12 ENCOUNTER — Other Ambulatory Visit (HOSPITAL_COMMUNITY)
Admission: RE | Admit: 2019-06-12 | Discharge: 2019-06-12 | Disposition: A | Discharge status: Home / Self Care | Payer: HRSA Program | Source: Ambulatory Visit | Attending: Gastroenterology | Admitting: Gastroenterology

## 2019-06-12 DIAGNOSIS — Z20822 Contact with and (suspected) exposure to covid-19: Secondary | ICD-10-CM | POA: Diagnosis not present

## 2019-06-12 DIAGNOSIS — Z01812 Encounter for preprocedural laboratory examination: Secondary | ICD-10-CM | POA: Insufficient documentation

## 2019-06-12 LAB — SARS CORONAVIRUS 2 (TAT 6-24 HRS): SARS Coronavirus 2: NEGATIVE

## 2019-06-15 ENCOUNTER — Encounter: Payer: Self-pay | Admitting: Gastroenterology

## 2019-06-15 ENCOUNTER — Ambulatory Visit (AMBULATORY_SURGERY_CENTER): Payer: Self-pay | Admitting: Gastroenterology

## 2019-06-15 ENCOUNTER — Other Ambulatory Visit: Payer: Self-pay

## 2019-06-15 VITALS — BP 99/63 | HR 57 | Temp 95.0°F | Resp 23 | Ht 64.0 in | Wt 154.0 lb

## 2019-06-15 DIAGNOSIS — D124 Benign neoplasm of descending colon: Secondary | ICD-10-CM

## 2019-06-15 DIAGNOSIS — D122 Benign neoplasm of ascending colon: Secondary | ICD-10-CM

## 2019-06-15 DIAGNOSIS — K635 Polyp of colon: Secondary | ICD-10-CM

## 2019-06-15 DIAGNOSIS — Z86010 Personal history of colonic polyps: Secondary | ICD-10-CM

## 2019-06-15 MED ORDER — SODIUM CHLORIDE 0.9 % IV SOLN: 500.0000 mL | Freq: Once | INTRAVENOUS | Status: DC

## 2019-06-15 NOTE — Patient Instructions (Signed)
YOU HAD AN ENDOSCOPIC PROCEDURE TODAY AT THE [facility]:   Refer to the procedure report that was given to you for any specific questions about what was found during the examination.  If the procedure report does not answer your questions, please call your gastroenterologist to clarify.  If you requested that your care partner not be given the details of your procedure findings, then the procedure report has been included in a sealed envelope for you to review at your convenience later.  YOU SHOULD EXPECT: Some feelings of bloating in the abdomen. Passage of more gas than usual.  Walking can help get rid of the air that was put into your GI tract during the procedure and reduce the bloating. If you had a lower endoscopy (such as a colonoscopy or flexible sigmoidoscopy) you may notice spotting of blood in your stool or on the toilet paper. If you underwent a bowel prep for your procedure, you may not have a normal bowel movement for a few days.  Please Note:  You might notice some irritation and congestion in your nose or some drainage.  This is from the oxygen used during your procedure.  There is no need for concern and it should clear up in a day or so.  SYMPTOMS TO REPORT IMMEDIATELY:   Following lower endoscopy (colonoscopy or flexible sigmoidoscopy):  Excessive amounts of blood in the stool  Significant tenderness or worsening of abdominal pains  Swelling of the abdomen that is new, acute  Fever of 100F or higher  For urgent or emergent issues, a gastroenterologist can be reached at any hour by calling [phone]. Do not use MyChart messaging for urgent concerns.    DIET:  We do recommend a small meal at first, but then you may proceed to your regular diet.  Drink plenty of fluids but you should avoid alcoholic beverages for 24 hours.  ACTIVITY:  You should plan to take it easy for the rest of today and you should NOT DRIVE or use heavy machinery until tomorrow (because  of the sedation medicines used during the test).    FOLLOW UP: Our staff will call the number listed on your records 48-72 hours following your procedure to check on you and address any questions or concerns that you may have regarding the information given to you following your procedure. If we do not reach you, we will leave a message.  We will attempt to reach you two times.  During this call, we will ask if you have developed any symptoms of COVID 19. If you develop any symptoms (ie: fever, flu-like symptoms, shortness of breath, cough etc.) before then, please call [phone].  If you test positive for Covid 19 in the 2 weeks post procedure, please call and report this information to us.    If any biopsies were taken you will be contacted by phone or by letter within the next 1-3 weeks.  Please call us at [phone] if you have not heard about the biopsies in 3 weeks.    SIGNATURES/CONFIDENTIALITY: You and/or your care partner have signed paperwork which will be entered into your electronic medical record.  These signatures attest to the fact that that the information above on your After Visit Summary has been reviewed and is understood.  Full responsibility of the confidentiality of this discharge information lies with you and/or your care-partner. 

## 2019-06-15 NOTE — Progress Notes (Signed)
Called to room to assist during endoscopic procedure.  Patient ID and intended procedure confirmed with present staff. Received instructions for my participation in the procedure from the performing physician.  

## 2019-06-15 NOTE — Progress Notes (Signed)
Pt's states no medical or surgical changes since previsit or office visit.  JB temp, CW vitals and SH IV.

## 2019-06-15 NOTE — Op Note (Signed)
[facility] Patient Name: Sheila Butler Procedure Date: 06/15/2019 9:03 AM MRN: [id number] Endoscopist: Mauri Pole , MD Age: 59 Referring MD:  Date of Birth: 1961-02-03 Gender: Female Account #: [id number] Procedure:                Colonoscopy Indications:              High risk colon cancer surveillance: Personal                            history of colonic polyps, High risk colon cancer                            surveillance: Personal history of multiple (3 or                            more) adenomas, High risk colon cancer                            surveillance: Personal history of adenoma less than                            10 mm in size. Inadequate prep on prior colonoscopy. Medicines:                Monitored Anesthesia Care Procedure:                Pre-Anesthesia Assessment:                           - Prior to the procedure, a History and Physical                            was performed, and patient medications and                            allergies were reviewed. The patient's tolerance of                            previous anesthesia was also reviewed. The risks                            and benefits of the procedure and the sedation                            options and risks were discussed with the patient.                            All questions were answered, and informed consent                            was obtained. Prior Anticoagulants: The patient has                            taken no previous anticoagulant or antiplatelet  agents. ASA Grade Assessment: II - A patient with                            mild systemic disease. After reviewing the risks                            and benefits, the patient was deemed in                            satisfactory condition to undergo the procedure.                           After obtaining informed consent, the colonoscope                            was passed under  direct vision. Throughout the                            procedure, the patient's blood pressure, pulse, and                            oxygen saturations were monitored continuously. The                            Colonoscope was introduced through the anus and                            advanced to the the cecum, identified by                            appendiceal orifice and ileocecal valve. The                            colonoscopy was performed without difficulty. The                            patient tolerated the procedure well. The quality                            of the bowel preparation was excellent. The                            ileocecal valve, appendiceal orifice, and rectum                            were photographed. Scope In: 9:07:33 AM Scope Out: 9:24:07 AM Scope Withdrawal Time: 0 hours 8 minutes 55 seconds  Total Procedure Duration: 0 hours 16 minutes 34 seconds  Findings:                 The perianal and digital rectal examinations were                            normal.  A 9 mm polyp was found in the proximal ascending                            colon. The polyp was flat. The polyp was removed                            with a cold snare. Resection and retrieval were                            complete.                           A less than 1 mm polyp was found in the descending                            colon. The polyp was sessile. The polyp was removed                            with a cold biopsy forceps. Resection and retrieval                            were complete.                           Non-bleeding internal hemorrhoids were found during                            retroflexion. The hemorrhoids were medium-sized.                           The exam was otherwise without abnormality. Complications:            No immediate complications. Estimated Blood Loss:     Estimated blood loss was minimal. Impression:               -  One 9 mm polyp in the proximal ascending colon,                            removed with a cold snare. Resected and retrieved.                           - One less than 1 mm polyp in the descending colon,                            removed with a cold biopsy forceps. Resected and                            retrieved.                           - Non-bleeding internal hemorrhoids.                           - The examination was otherwise normal. Recommendation:           -  Patient has a contact number available for                            emergencies. The signs and symptoms of potential                            delayed complications were discussed with the                            patient. Return to normal activities tomorrow.                            Written discharge instructions were provided to the                            patient.                           - Resume previous diet.                           - Continue present medications.                           - Await pathology results.                           - Repeat colonoscopy in 5 years for surveillance                            based on pathology results. Mauri Pole, MD 06/15/2019 9:30:57 AM This report has been signed electronically.

## 2019-06-15 NOTE — Progress Notes (Signed)
pt tolerated well. VSS. awake and to recovery. Report given to RN.  

## 2019-06-19 ENCOUNTER — Telehealth: Payer: Self-pay

## 2019-06-19 ENCOUNTER — Encounter: Payer: Self-pay | Admitting: Gastroenterology

## 2019-06-19 NOTE — Telephone Encounter (Signed)
  Follow up Call-  Call back number 06/15/2019 12/30/2017  Post procedure Call Back phone  # [phone]  Permission to leave phone message Yes Yes  Some recent data might be hidden     Patient questions:  Do you have a fever, pain , or abdominal swelling? No. Pain Score  0 *  Have you tolerated food without any problems? Yes.    Have you been able to return to your normal activities? Yes.    Do you have any questions about your discharge instructions: Diet   No. Medications  No. Follow up visit  No.  Do you have questions or concerns about your Care? No.  Actions: * If pain score is 4 or above: No action needed, pain <4.  1. Have you developed a fever since your procedure? no  2.   Have you had an respiratory symptoms (SOB or cough) since your procedure? no  3.   Have you tested positive for COVID 19 since your procedure no  4.   Have you had any family members/close contacts diagnosed with the COVID 19 since your procedure?  no   If yes to any of these questions please route to Joylene John, RN and Erenest Rasher, RN

## 2019-08-22 ENCOUNTER — Other Ambulatory Visit: Payer: Self-pay | Admitting: Gynecologic Oncology

## 2019-08-22 DIAGNOSIS — N6310 Unspecified lump in the right breast, unspecified quadrant: Secondary | ICD-10-CM

## 2019-08-24 ENCOUNTER — Ambulatory Visit
Admission: RE | Admit: 2019-08-24 | Discharge: 2019-08-24 | Disposition: A | Discharge status: Home / Self Care | Payer: Self-pay | Source: Ambulatory Visit | Attending: Gynecologic Oncology | Admitting: Gynecologic Oncology

## 2019-08-24 ENCOUNTER — Other Ambulatory Visit: Payer: Self-pay

## 2019-08-24 DIAGNOSIS — N6310 Unspecified lump in the right breast, unspecified quadrant: Secondary | ICD-10-CM

## 2020-03-30 IMAGING — US ULTRASOUND RIGHT BREAST LIMITED
1 series · 9 of 9 positions shown · non-contrast
Comparison: 08/18/2018 and 01/30/2016 from [HOSPITAL] OBGYN

CLINICAL DATA: The patient returns after screening study for
evaluation of possible RIGHT breast asymmetry.

EXAM:
DIGITAL DIAGNOSTIC RIGHT MAMMOGRAM WITH CAD AND TOMO
ULTRASOUND RIGHT BREAST

[Series 1: ultrasound right breast limited · 0.06mm/px · 9 of 9 slices shown]
[im 1/9]
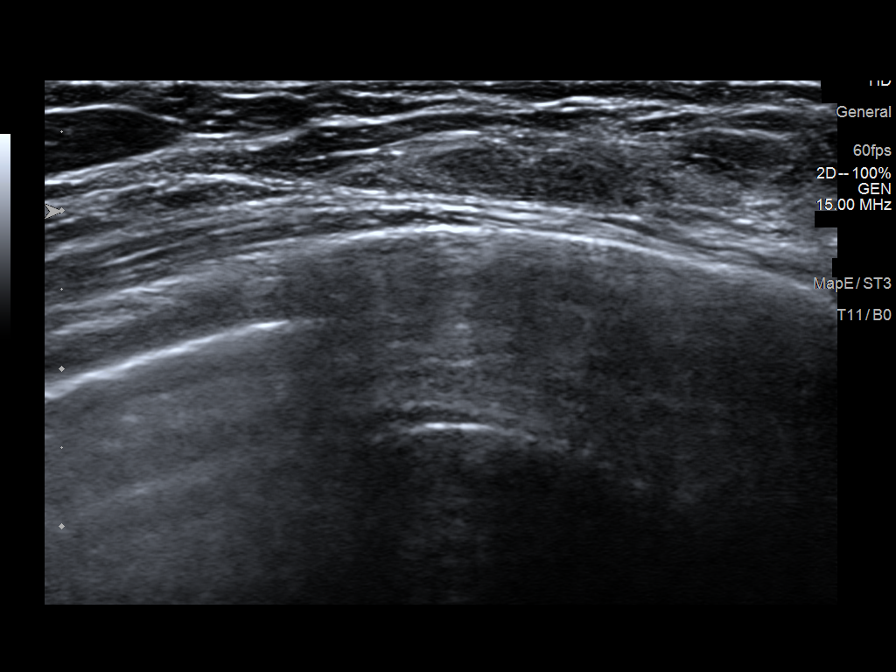
[im 2/9]
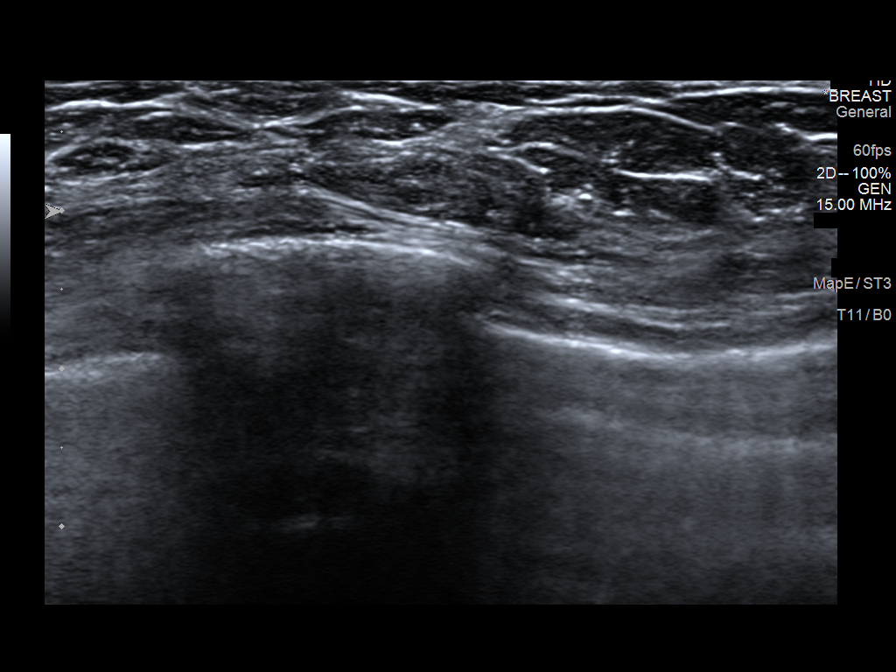
[im 3/9]
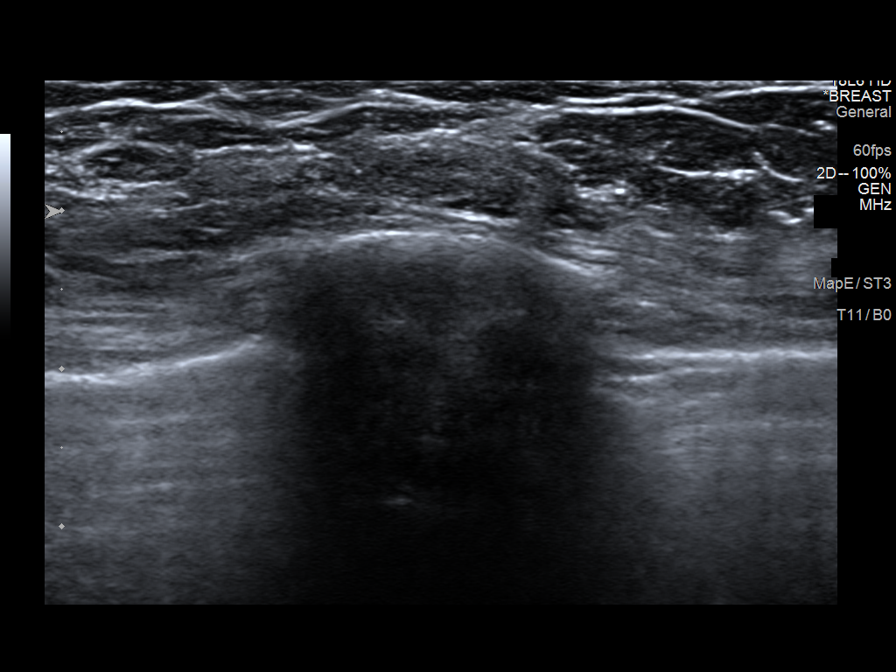
[im 4/9]
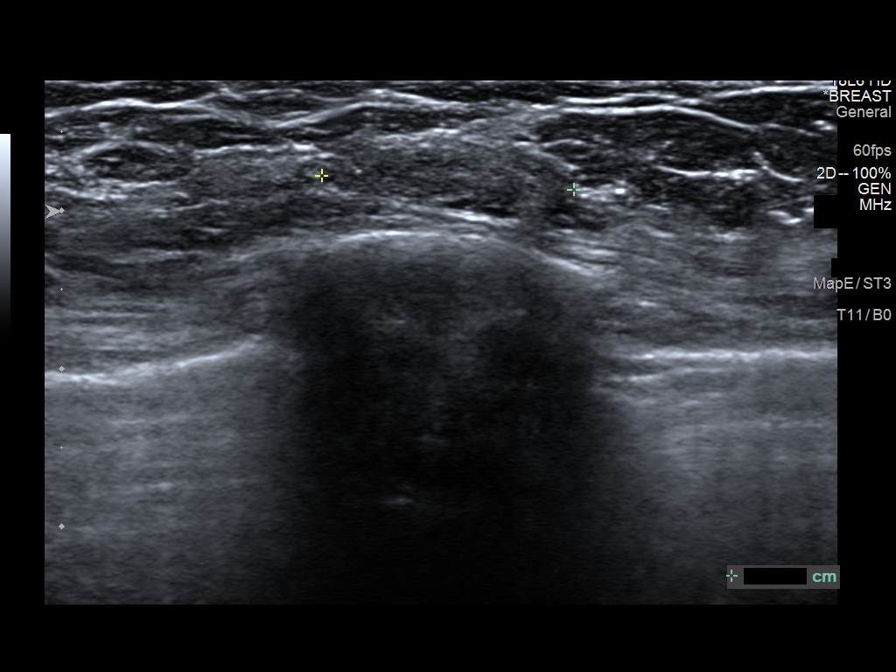
[im 5/9]
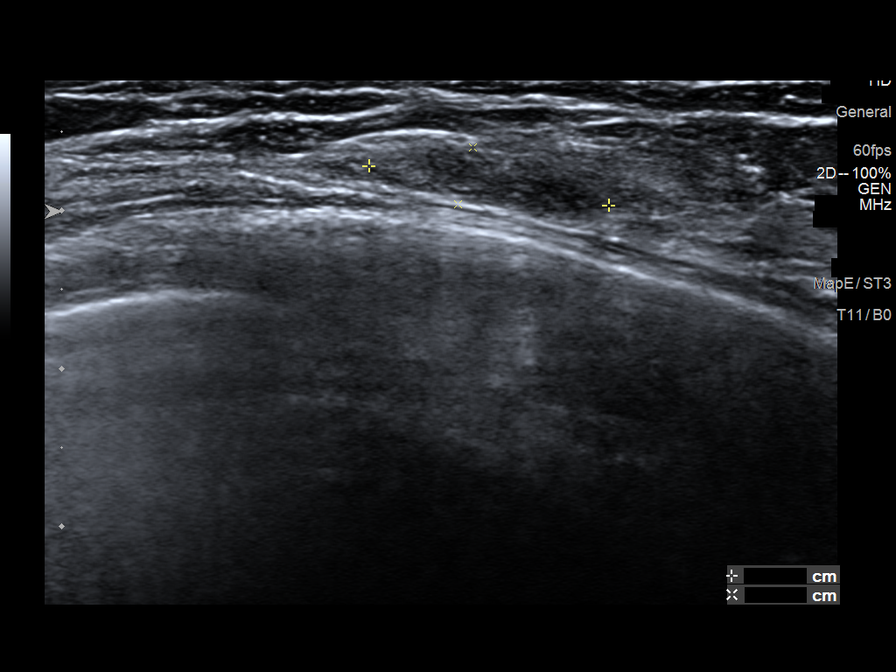
[im 6/9]
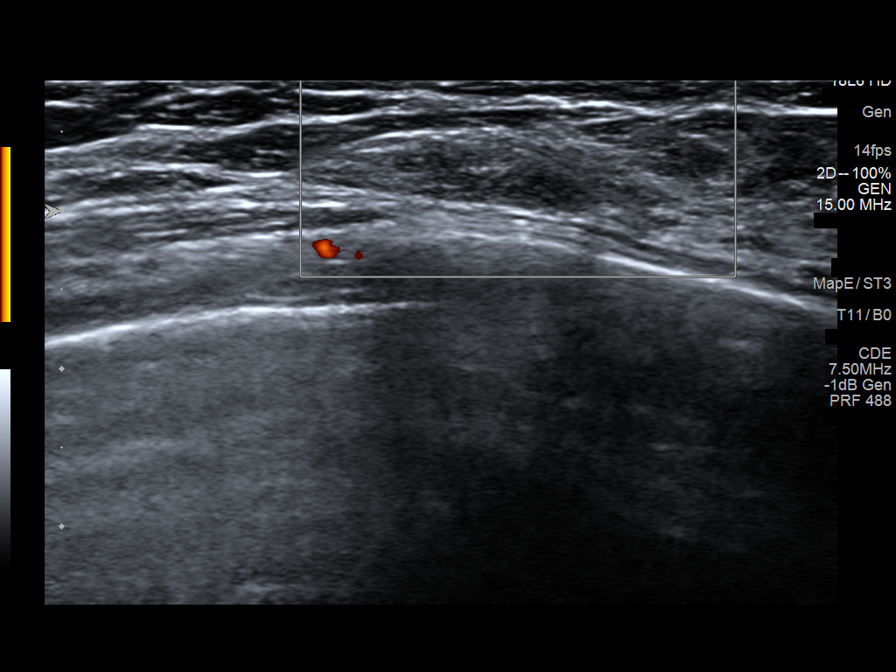
[im 7/9]
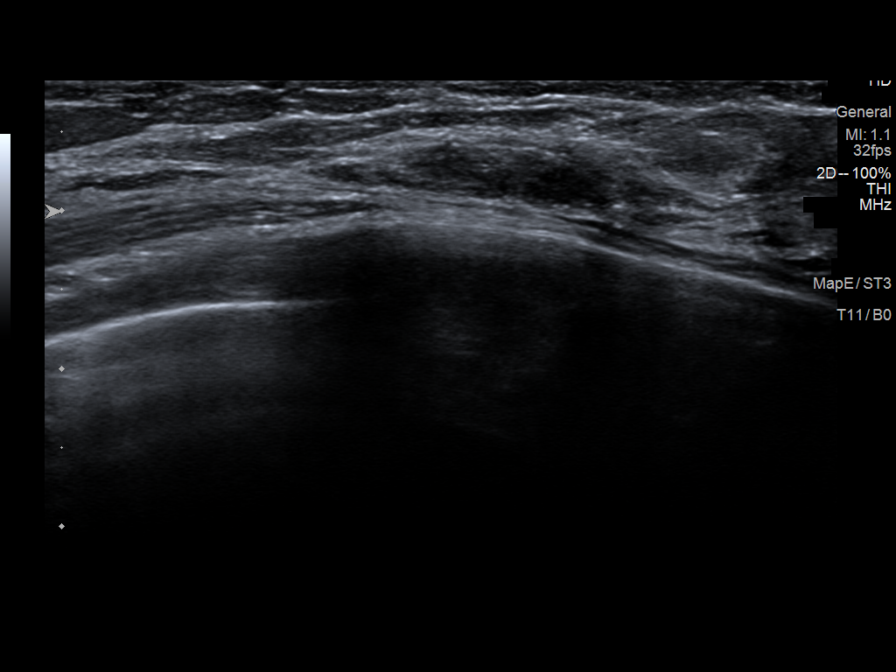
[im 8/9]
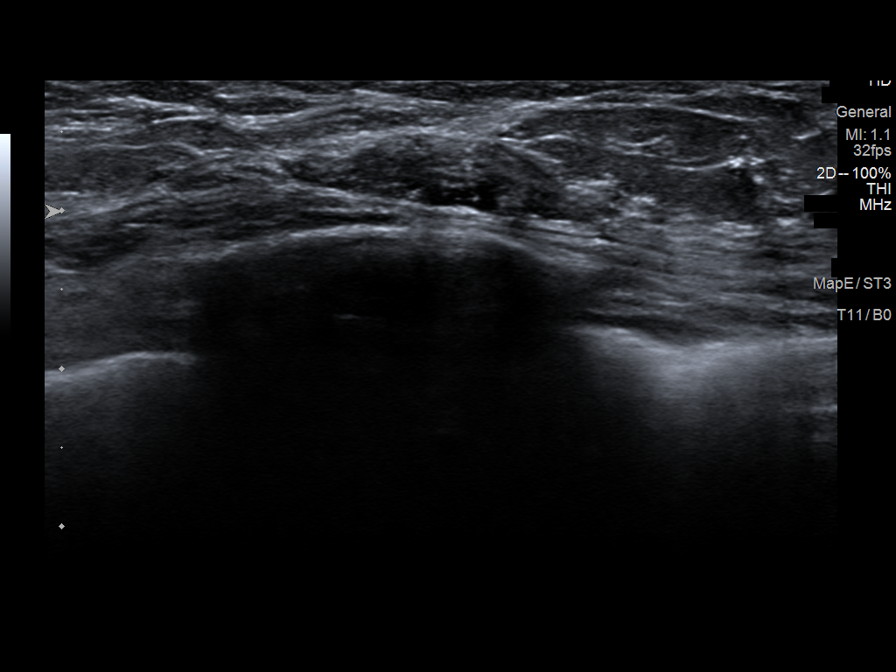
[im 9/9]
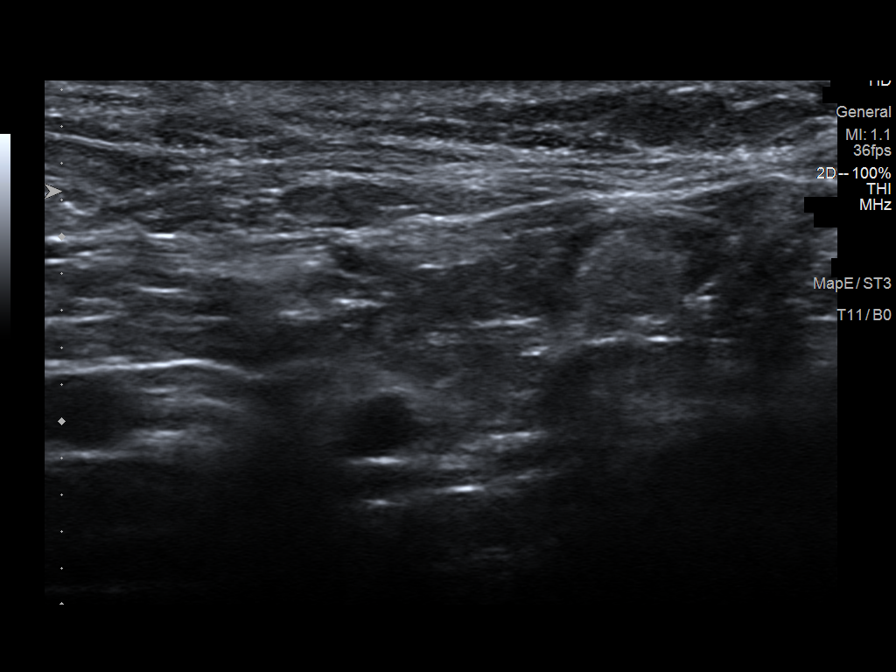

[9 of 9 positions shown; findings below may reference images not displayed]

ACR Breast Density Category c: The breast tissue is heterogeneously
dense, which may obscure small masses.
FINDINGS: Additional 2-D and 3-D images are performed. These views confirm
presence of asymmetry in the posterior LATERAL portion of the RIGHT
breast, best seen on the oblique view.

Mammographic images were processed with CAD.

On physical exam, I palpate no abnormality in the LATERAL aspect of
the RIGHT breast.

Targeted ultrasound is performed, showing a subtle mixed
echogenicity oval mass with irregular margins in the 9 o'clock
location of the RIGHT breast 6 centimeters from the nipple. There is
minimal internal vascularity. Mass measures 1.6 x 1.5 x
centimeters. Evaluation of the RIGHT axilla is negative for
adenopathy.

A BB is placed over the mass seen sonographically in the 9 o'clock
location. Tangential views show this sonographic abnormality is
likely the mammographic abnormality, but the depth of the area in
question limits this confirmation.
IMPRESSION: Indeterminate asymmetry in the 9 o'clock location of the RIGHT
breast, correlating with the subtle sonographically detected mass
measured 1.6 centimeters.

No RIGHT axillary adenopathy.

RECOMMENDATION:
Recommend ultrasound-guided core biopsy of mass in the 9 o'clock
location of the RIGHT breast. Following clip films, close
correlation with the initial mammographic finding is recommended to
confirm that the lesion seen on mammogram and on ultrasound are one
in the same.

I have discussed the findings and recommendations with the patient.
Results were also provided in writing at the conclusion of the
visit. If applicable, a reminder letter will be sent to the patient
regarding the next appointment.

BI-RADS CATEGORY  4: Suspicious.

## 2020-07-21 ENCOUNTER — Other Ambulatory Visit: Payer: Self-pay | Admitting: *Deleted

## 2020-07-21 DIAGNOSIS — R5381 Other malaise: Secondary | ICD-10-CM

## 2020-09-23 ENCOUNTER — Other Ambulatory Visit: Payer: Self-pay | Admitting: Dermatology

## 2020-09-23 DIAGNOSIS — Z1231 Encounter for screening mammogram for malignant neoplasm of breast: Secondary | ICD-10-CM

## 2020-10-03 ENCOUNTER — Ambulatory Visit
Admission: RE | Admit: 2020-10-03 | Discharge: 2020-10-03 | Disposition: A | Discharge status: Home / Self Care | Payer: No Typology Code available for payment source | Source: Ambulatory Visit

## 2020-10-03 ENCOUNTER — Other Ambulatory Visit: Payer: Self-pay

## 2020-10-03 DIAGNOSIS — Z1231 Encounter for screening mammogram for malignant neoplasm of breast: Secondary | ICD-10-CM

## 2021-04-08 ENCOUNTER — Encounter: Payer: Self-pay | Admitting: Gastroenterology

## 2021-04-08 ENCOUNTER — Telehealth: Payer: Self-pay

## 2021-04-08 NOTE — Telephone Encounter (Signed)
Called patient and both phone # and got voice mails. Left message on each to please call back or MyChart [place] to let us know why she is cancelling appt with Dr. Silverio Decamp, and if we can reschedule it.

## 2021-04-24 ENCOUNTER — Ambulatory Visit: Payer: No Typology Code available for payment source | Admitting: Gastroenterology

## 2021-08-20 ENCOUNTER — Other Ambulatory Visit: Payer: Self-pay | Admitting: Dermatology

## 2021-08-20 DIAGNOSIS — Z1231 Encounter for screening mammogram for malignant neoplasm of breast: Secondary | ICD-10-CM

## 2021-10-09 ENCOUNTER — Ambulatory Visit
Admission: RE | Admit: 2021-10-09 | Discharge: 2021-10-09 | Disposition: A | Discharge status: Home / Self Care | Payer: No Typology Code available for payment source | Source: Ambulatory Visit | Attending: Dermatology | Admitting: Dermatology

## 2021-10-09 DIAGNOSIS — Z1231 Encounter for screening mammogram for malignant neoplasm of breast: Secondary | ICD-10-CM

## 2022-05-03 IMAGING — MG MM DIGITAL SCREENING BILAT W/ TOMO AND CAD
8 series · 9 of 24 positions shown · non-contrast
Comparison: Previous exam(s).

CLINICAL DATA: Screening.

EXAM:
DIGITAL SCREENING BILATERAL MAMMOGRAM WITH TOMOSYNTHESIS AND CAD
TECHNIQUE: Bilateral screening digital craniocaudal and mediolateral oblique
mammograms were obtained. Bilateral screening digital breast
tomosynthesis was performed. The images were evaluated with
computer-aided detection.

[L MLO synth-2D]
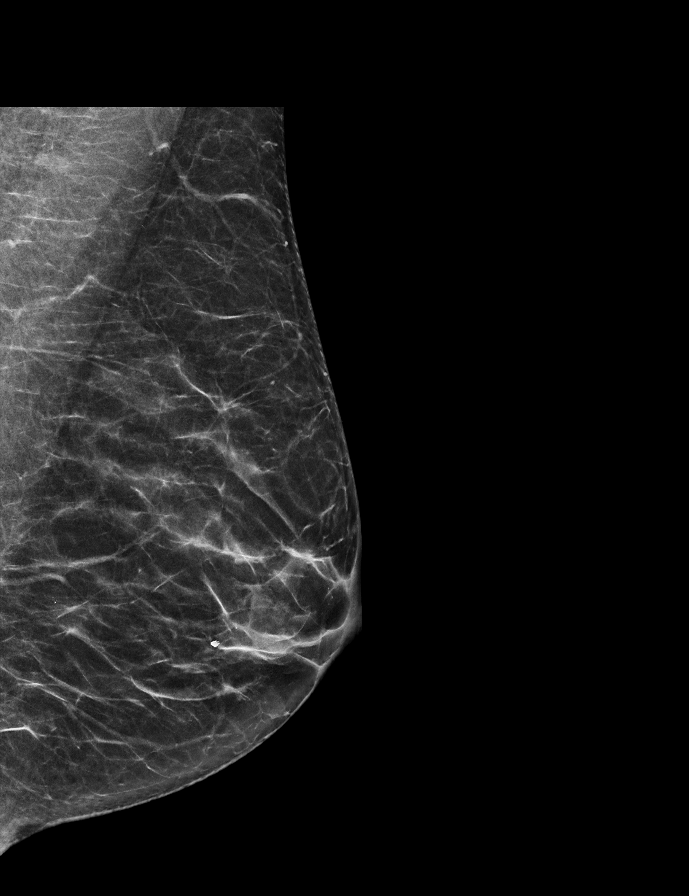

[L CC synth-2D]
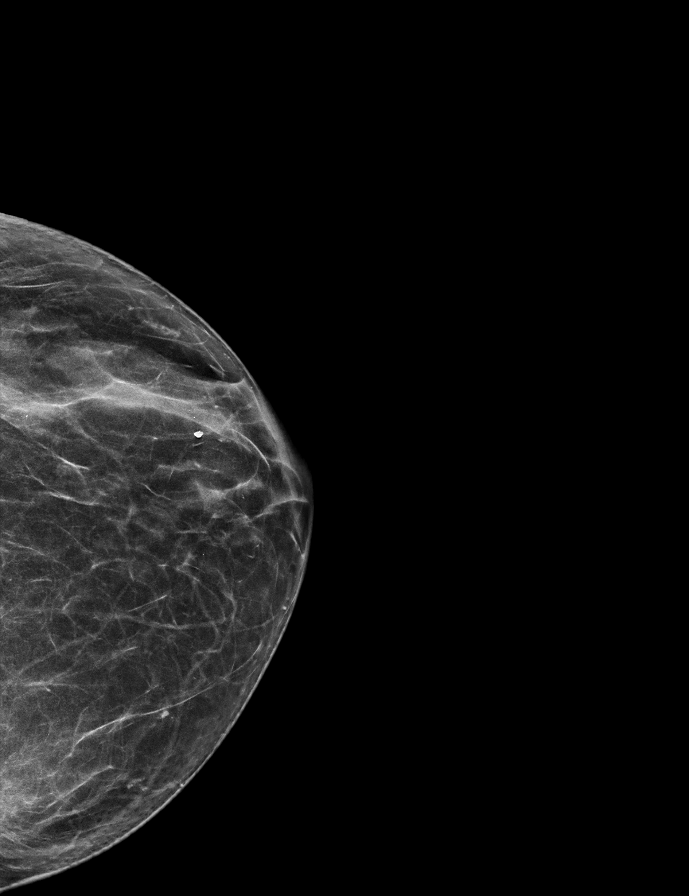

[R MLO synth-2D]
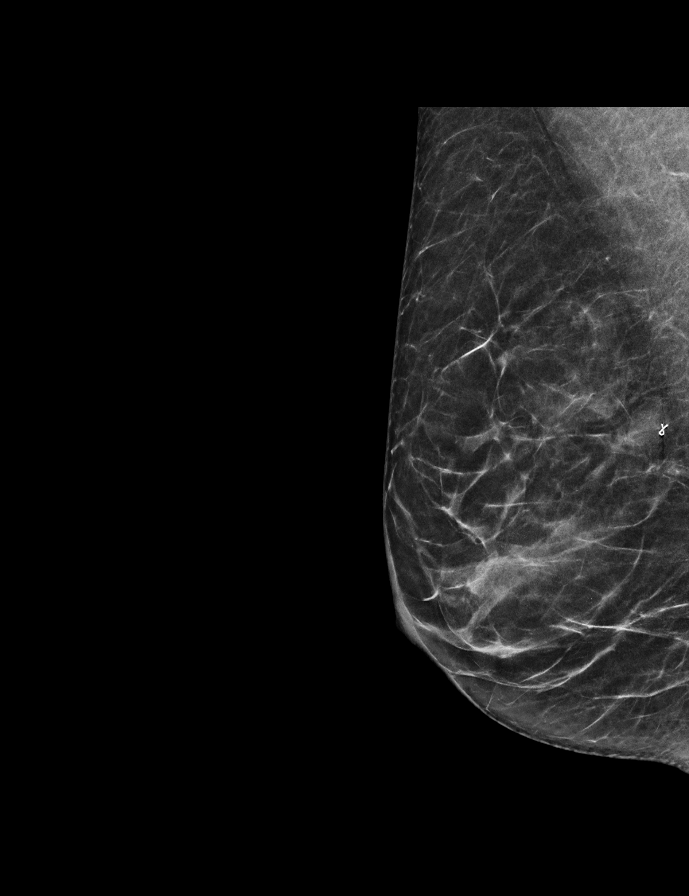

[R CC synth-2D]
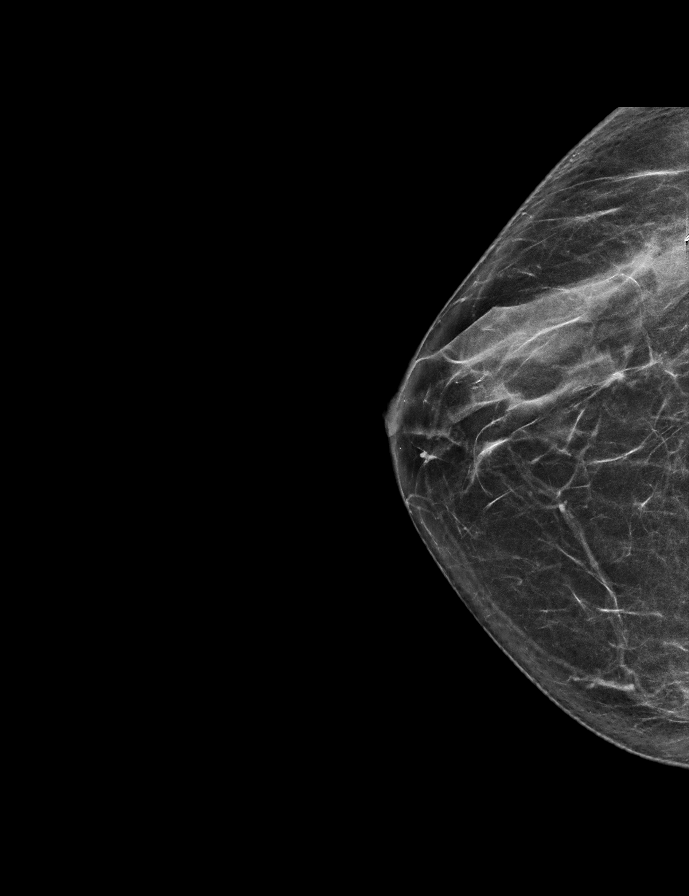

[L MLO tomo · 2 of 54 frames shown]
[frame 18/54]
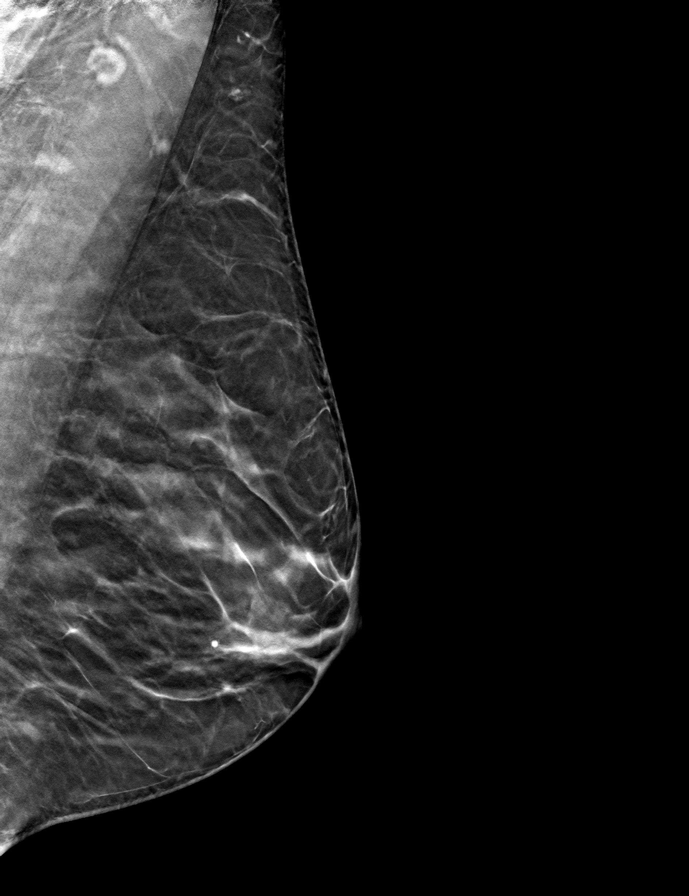
[frame 27/54]
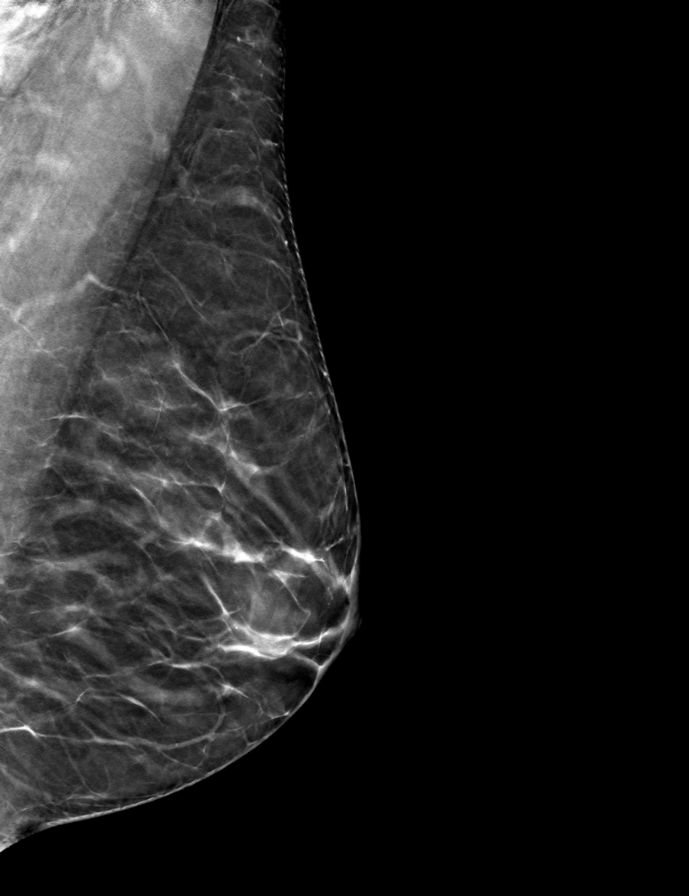

[L CC tomo · tomo slice 28/55.0]
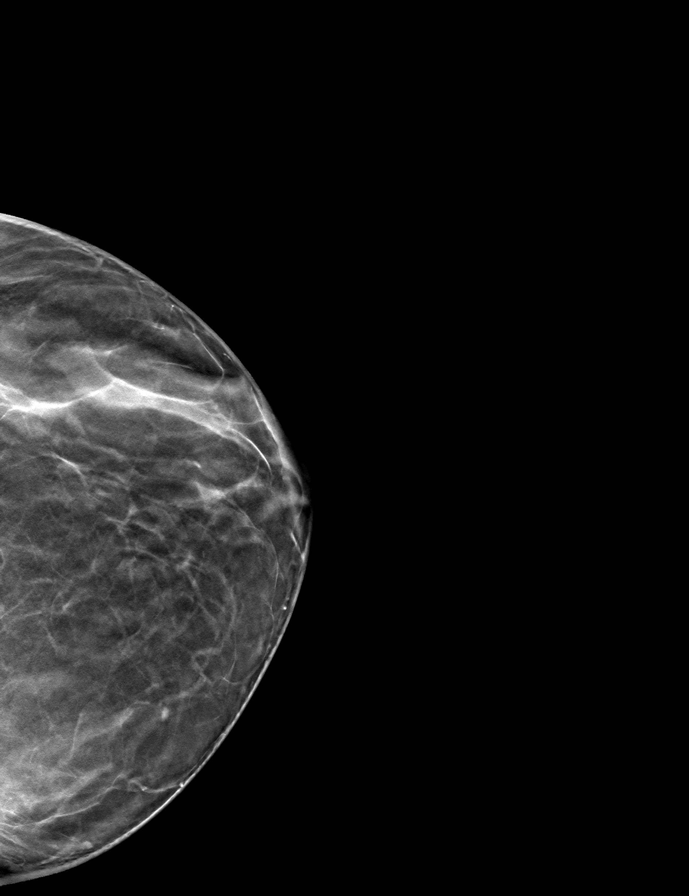

[R CC tomo · tomo slice 29/57.0]
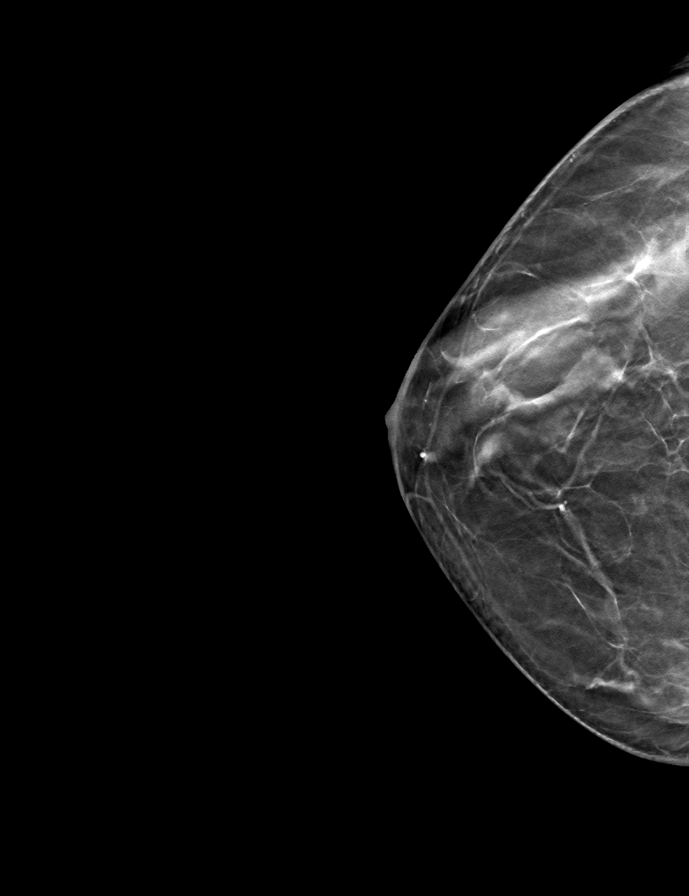

[R MLO tomo · tomo slice 24/47.0]
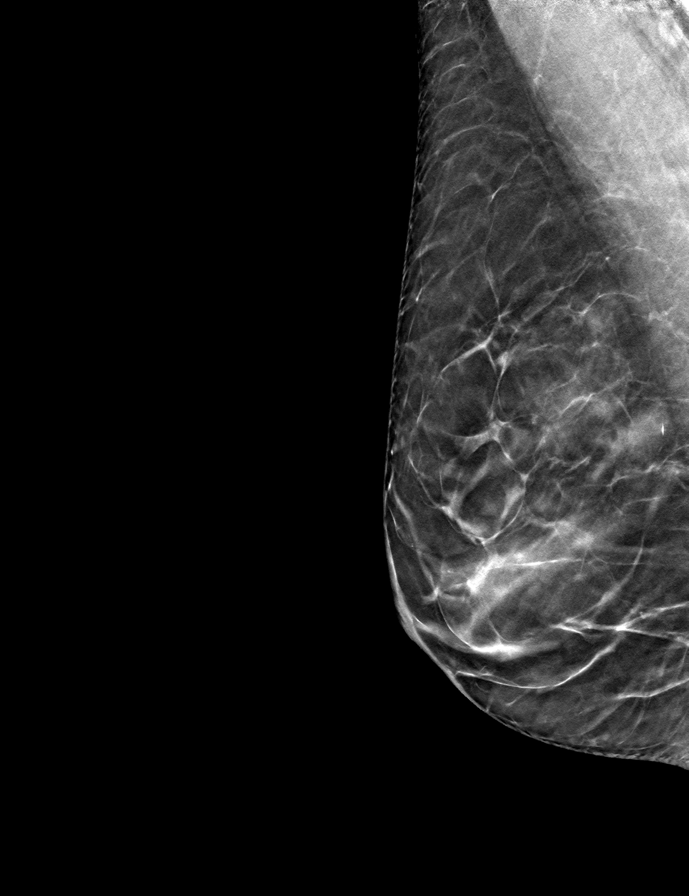

[9 of 24 positions shown; findings below may reference images not displayed]

ACR Breast Density Category c: The breast tissue is heterogeneously
dense, which may obscure small masses.
FINDINGS: There are no findings suspicious for malignancy.
IMPRESSION: No mammographic evidence of malignancy. A result letter of this
screening mammogram will be mailed directly to the patient.

RECOMMENDATION:
Screening mammogram in one year. (Code:Q3-W-BC3)

BI-RADS CATEGORY  1: Negative.
